# Patient Record
Sex: Female | Born: 1985 | Race: Asian | Hispanic: No | Marital: Single | Smoking: Never smoker
Health system: Southern US, Academic
[De-identification: ages and names within clinical notes are randomized; demographics above are authoritative.]

## PROBLEM LIST (undated history)

## (undated) DIAGNOSIS — Z789 Other specified health status: Secondary | ICD-10-CM

## (undated) HISTORY — PX: HX NO SURGICAL PROCEDURES: 2100001501

---

## 2009-08-03 HISTORY — PX: NM RENAL LASIX (ARMC HX): HXRAD1213

## 2013-11-20 ENCOUNTER — Other Ambulatory Visit (INDEPENDENT_AMBULATORY_CARE_PROVIDER_SITE_OTHER): Payer: PRIVATE HEALTH INSURANCE

## 2013-11-20 ENCOUNTER — Ambulatory Visit (INDEPENDENT_AMBULATORY_CARE_PROVIDER_SITE_OTHER): Payer: PRIVATE HEALTH INSURANCE

## 2013-11-20 ENCOUNTER — Encounter (INDEPENDENT_AMBULATORY_CARE_PROVIDER_SITE_OTHER): Payer: Self-pay

## 2013-11-20 VITALS — BP 123/83 | HR 89 | Temp 98.4°F | Resp 16 | Ht 63.0 in | Wt 148.0 lb

## 2013-11-20 DIAGNOSIS — R0789 Other chest pain: Secondary | ICD-10-CM

## 2013-11-20 MED ORDER — IBUPROFEN 200 MG TABLET
200.0000 mg | ORAL_TABLET | Freq: Once | ORAL | Status: AC
Start: 2013-11-20 — End: 2013-11-20

## 2013-11-20 MED ORDER — IBUPROFEN 400 MG TABLET
ORAL_TABLET | ORAL | Status: AC
Start: 2013-11-20 — End: ?

## 2013-11-20 NOTE — Progress Notes (Addendum)
History of Present Illness: Joann Garcia is a 28 y.o. female who presents to the Urgent Care/Student Health today with chief complaint of    Chief Complaint    Chest Pain  for past 24 hours. radiates to left side. pt states took combiflam on friday for cramping.       .     Location: left side of chest   Quality: pain   Onset: yesterday afternoon   Severity: mild to moderate   Timing: continuing   Context: Pt reports chest pain for the past 2 days with pain beginning while sitting on her bed working on her laptop. Pt reports pain radiating to her back and went away after a short time. Pt denies any trauma to the area. Pt denies any Hx of heart problems. Pt reports starting menstrual period in the past few days. Pt denies any other medical problems. Pt denies any known drug allergies. Pt denies any known sick contacts with similar sxs.   Modifying factors: Pt denies taking any medication for relief of sxs. Pt reports taking combiflam  Associated symptoms: left sided chest/intercostal muscle pain/tenderness, menstrual cramping   Denies: fever, N/V/D, abd pain, SOB, fatigue, myalgias, rashes, headache, palpitations, numbness, tingling, weakness, lower extremity swelling     I reviewed and confirmed the patient's past medical history taken by the nurse or medical assistant with the addition of the following:    Past Medical History:    No past medical history on file.  Past Surgical History:    Past Surgical History   Procedure Laterality Date    Hx no surgical procedures       Allergies:  No Known Allergies  Medications:    Current Outpatient Prescriptions   Medication Sig    Ibuprofen (MOTRIN IB) 200 mg Oral Tablet Take 1 Tab (200 mg total) by mouth One time for 1 dose    Ibuprofen (MOTRIN) 400 mg Oral Tablet Take one pill by mouth every 6 hours as needed with food.     Social History:    History   Substance Use Topics    Smoking status: Never Smoker     Smokeless tobacco: Never Used    Alcohol Use: No          Family History: No significant family history.  Family History   Problem Relation Age of Onset    Healthy Mother     Healthy Father      Review of Systems:    General: no fever and no fatigue  ENT:  no sore throat  Pulmonary:   no dry cough and no SOB  Cardiovascular:  no palpitations  Gastrointestinal:  no nausea, no vomiting, no diarrhea and no abdominal pain  Musculoskeletal:  no myalgias and left side chest/intercostal muscle tenderness/pain   Neurologic:  no paresthesias, no weakness and no headache  Skin:  no rash, no lower extremity swelling  All other review of systems were negative    Physical Exam:  Vital signs:   Filed Vitals:    11/20/13 1439   BP: 123/83   Pulse: 89   Temp: 36.9 C (98.4 F)   TempSrc: Thermal Scan   Resp: 16   Height: 1.6 m (5\' 3" )   Weight: 67.132 kg (148 lb)   SpO2: 98%     Patient's last menstrual period was 11/17/2013.    General:  Well appearing and No acute distress  Head:  Normocephalic and Atraumatic  Eyes:  Normal lids/lashes and normal conjunctiva  Neck:  supple  Pulmonary:  clear to auscultation bilaterally, no wheezes, no rales and no rhonchi  Cardiovascular:  regular rate/rhythm, normal S1/S2 and no murmur/rub/gallop  Gastrointestinal:  Non-distended, normal bowel sounds, soft, non-tender, no hepatosplenomegaly, no rebound and no guarding  Musculoskeletal:  intercostal tenderness to palpation below left breast   Skin:  warm/dry and no rash  Psychiatric:  Appropriate affect and behavior and Normal speech  Neurologic:   Alert and oriented x 3 and No meningeal signs noted  Hem/Lymph:  No lymphadenopathy    Data Reviewed:    Radiography:  CXR: XR-negative    Course: Condition at discharge: Good     Differential Diagnosis: musculoskeletal pain vs rib contusion vs pneumothorax vs PE vs herpes zoster     Assessment:   1. Chest pain, atypical        Plan:    Orders Placed This Encounter    CANCELED: Abdominal Flat and Upright with PA-Lateral Chest    CXR PA and Lat     Ibuprofen (MOTRIN IB) 200 mg Oral Tablet    Ibuprofen (MOTRIN) 400 mg Oral Tablet     Medications E-M: Ibuprofen  200mg  tablet  Route of Administration: PO  NDC #: [id number]  LOT #: [id number]  Expiration date: 09/22/14  Manufacturer: [employer]  Clinic Supplied: Yes    Pt was PERC negative.   Advised pt to rest and drink plenty of fluids.   Advised pt to take ibuprofen for relief of pain.     Go to Emergency Department immediately for further work up if any concerning symptoms.  Plan was discussed and patient verbalized understanding.  If symptoms are worsening or not improving the patient should return to the Urgent Care/Student Health for further evaluation.    I am scribing for, and in the presence of, Dr. Jesse SansAllison Tadros for services provided on 11/20/2013.  Joann Garcia, SCRIBE     Joann Garcia, [place]CRIB[place] 11/20/2013, 15:36  I personally performed the services described in this documentation, as scribed  in my presence, and it is both accurate  and complete.    Jesse SansAllison Tadros, MD

## 2013-11-20 NOTE — Patient Instructions (Signed)
Chest Wall Pain  Chest wall pain is pain in or around the bones and muscles of your chest. It may take up to 6 weeks to get better. It may take longer if you must stay physically active in your work and activities.   CAUSES   Chest wall pain may happen on its own. However, it may be caused by:   A viral illness like the flu.   Injury.   Coughing.   Exercise.   Arthritis.   Fibromyalgia.   Shingles.  HOME CARE INSTRUCTIONS    Avoid overtiring physical activity. Try not to strain or perform activities that cause pain. This includes any activities using your chest or your abdominal and side muscles, especially if heavy weights are used.   Put ice on the sore area.   Put ice in a plastic bag.   Place a towel between your skin and the bag.   Leave the ice on for 15-20 minutes per hour while awake for the first 2 days.   Only take over-the-counter or prescription medicines for pain, discomfort, or fever as directed by your caregiver.  SEEK IMMEDIATE MEDICAL CARE IF:    Your pain increases, or you are very uncomfortable.   You have a fever.   Your chest pain becomes worse.   You have new, unexplained symptoms.   You have nausea or vomiting.   You feel sweaty or lightheaded.   You have a cough with phlegm (sputum), or you cough up blood.  MAKE SURE YOU:    Understand these instructions.   Will watch your condition.   Will get help right away if you are not doing well or get worse.  Document Released: 07/20/2005 Document Revised: 10/12/2011 Document Reviewed: 03/16/2011  [facility] Patient Information 2015 [place]. This information is not intended to replace advice given to you by your health care provider. Make sure you discuss any questions you have with your health care provider.    HEALTH [facility]UC[facility]  [address]  [place] [id number]  Dept: [phone]  Dept Fax: [phone]  Loc: [phone]  Loc Fax: [phone]    Attending  Caregiver: Jesse SansAllison Levern Kalka, MD      Today's orders:   Orders Placed This Encounter    CANCELED: Abdominal Flat and Upright with PA-Lateral Chest    CXR PA and Lat    Ibuprofen (MOTRIN IB) 200 mg Oral Tablet           Prescription(s) E-Rx to:  [facility] - [place], [place] - [address] AT [facility] [address]      PLEASE ACTIVATE YOUR [employer] MYCHART. THIS IS ONE EASY WAY TO COMMUNICATE WITH [facility].  SEE THE CODE ON THIS FORM FOR ACCESS.    -----------------------------PRIVACY INFORMATION-----------------------------------------------  As a [place] student, regardless of your age, we cannot discuss your personal health information with a parent, spouse, family member or anyone else without your expressed consent.    This policy does not include:  Individuals who would have a legitimate reason to access your records and information to assist in your care under the provisions of HIPAA [facility](Health Insurance Portability and Accountability Act) law;   Individuals with whom you have previously given expressed written consent to do so, such a legal guardian or Power of [address].    No one can access your [facility], unless you give them your account sign on and password. You will receive emails  from [facility].  This means anyone who has access to the email account you provided can see this notification. There will be no private medical information included in these emails. This notification of  new medical information  available in your [facility], may be information that you do not want others to know.   _______________________________________________________________________    If your symptoms persist, worsen or you develop any new or concerning symptoms please call [facility] for at [phone] for a follow up appointment.   If your symptoms are severe, go immediately to the emergency department or call 911.  Please check with your health insurance coverage for these types  of visits.   Please keep all follow up visit recommended at today's visit.    If you received x-rays during your visit, be aware that the final and formal interpretation of those films by a radiologist will occur after your discharge.  If there is a significant discrepancy identified after your discharge, we will contact you at the telephone number provided during registration.  These results are available for your review on [facility].    Please refer to your [facility] for lab test results. Lab test results related to HIV, STI's and pregnancy are considered private and are therefore not immediately visible in your [facility]Chart, we may still be able to send a generic MyChart message for these results.  If you have cultures pending for sexually transmitted diseases, you will be contacted by phone if your cultures are positive.     Positive cultures are reported to the [facility] Department of Health, as required by state law. These results are considered private on [facility] and can only be released by the provider.We will not contact you if the results are negative.    It is very important that we have a phone number that is the single best way to contact you in the event that we become aware of important clinical information or concerns after your discharge.  If the phone number you provided at registration is NOT this number you should inform staff and registration prior to leaving.     Please call  Student Health at [phone] with any further questions.    Instructions discussed with patient upon discharge by clinical staff with all questions answered.    Jesse SansAllison Nathaniel Wakeley, MD 11/20/2013, 15:15

## 2015-01-04 ENCOUNTER — Ambulatory Visit (INDEPENDENT_AMBULATORY_CARE_PROVIDER_SITE_OTHER): Payer: PRIVATE HEALTH INSURANCE

## 2015-01-04 ENCOUNTER — Encounter (INDEPENDENT_AMBULATORY_CARE_PROVIDER_SITE_OTHER): Payer: Self-pay

## 2015-01-04 VITALS — BP 133/89 | HR 126 | Temp 98.4°F | Resp 18 | Ht 64.0 in | Wt 164.4 lb

## 2015-01-04 DIAGNOSIS — B349 Viral infection, unspecified: Secondary | ICD-10-CM

## 2015-01-04 DIAGNOSIS — J029 Acute pharyngitis, unspecified: Secondary | ICD-10-CM

## 2015-01-04 MED ORDER — FLUTICASONE PROPIONATE 50 MCG/ACTUATION NASAL SPRAY,SUSPENSION
1.0000 | Freq: Every day | NASAL | Status: AC
Start: 2015-01-04 — End: ?

## 2015-01-04 NOTE — Patient Instructions (Signed)
Salt Water Gargle  This solution will help make your mouth and throat feel better.  HOME CARE INSTRUCTIONS    Mix 1 teaspoon of salt in 8 ounces of warm water.   Gargle with this solution as much or often as you need or as directed. Swish and gargle gently if you have any sores or wounds in your mouth.   Do not swallow this mixture.     This information is not intended to replace advice given to you by your health care provider. Make sure you discuss any questions you have with your health care provider.     Document Released: 04/23/2004 Document Revised: 10/12/2011 Document Reviewed: 09/14/2008  [facility] Patient Information 2016 [place], [place].  Viral Infections  A virus is a type of germ. Viruses can cause:   Minor sore throats.   Aches and pains.   Headaches.   Runny nose.   Rashes.   Watery eyes.   Tiredness.   Coughs.   Loss of appetite.   Feeling sick to your stomach (nausea).   Throwing up (vomiting).   Watery poop (diarrhea).  HOME CARE    Only take medicines as told by your doctor.   Drink enough water and fluids to keep your pee (urine) clear or pale yellow. Sports drinks are a good choice.   Get plenty of rest and eat healthy. Soups and broths with crackers or rice are fine.  GET HELP RIGHT AWAY IF:    You have a very bad headache.   You have shortness of breath.   You have chest pain or neck pain.   You have an unusual rash.   You cannot stop throwing up.   You have watery poop that does not stop.   You cannot keep fluids down.   You or your child has a temperature by mouth above 102 F (38.9 C), not controlled by medicine.   Your baby is older than 3 months with a rectal temperature of 102 F (38.9 C) or higher.   Your baby is 36 months old or younger with a rectal temperature of 100.4 F (38 C) or higher.  MAKE SURE YOU:    Understand these instructions.   Will watch this condition.   Will get help right away if you are not doing well or get worse.     This information  is not intended to replace advice given to you by your health care provider. Make sure you discuss any questions you have with your health care provider.     Document Released: 07/02/2008 Document Revised: 10/12/2011 Document Reviewed: 11/25/2010  ExitCare Patient Information 2016 [place].     [facility]      Operated by [facility]  [address] [place], [place] [address]  Phone: [phone])  Fax: [phone]  [email or web address]  Open Daily 8:00a - 8:00p    Closed Thanksgiving and Christmas Day  [facility]     Operated by [facility]  [address]  [facility] and Education Building  [place], [place] [address]  Phone: [phone])  Fax: [phone]  [email or web address]  Open M-F  8:00a - 8:00p  Sat              10:00a - 4:00p  Sun             Closed  Closed all [place] Holidays        Attending Caregiver: Theresia Majors, FNP  Today's orders:   Orders Placed This Encounter    POCT RAPID STREP A    fluticasone (FLONASE) 50 mcg/actuation Nasal Spray, Suspension        Prescription(s) E-Rx to:  [facility] - [place], [place] - [address] AT [address]    ________________________________________________________________________  Short Term Disability and Family Medical Leave Act  [facility] does NOT provide assistance with any disability applications.  If you feel your medical condition requires you to be on disability, you will need to follow up with  your primary care physician or a specialist.  We apologize for any inconvenience.    For Medication Prescribed by [facility]:  As an Urgent Care facility, our clinic does NOT offer prescription refills over the telephone.    If you need more of the medication one of our medical providers prescribed, you will  either need to be re-evaluated by us or see your primary care physician.       ________________________________________________________________________      It is very important that we have a phone number.  This is the single best way to contact you in the event that we become aware of important clinical information or concerns after your discharge.  If the phone number you provided at registration is NOT this number you should inform staff and registration prior to leaving.      Your treatment and evaluation today was focused on identifying and treating potentially emergent conditions based on your presenting signs, symptoms, and history.  The resulting initial clinical impression and treatment plan is not intended to be definitive or a substitute for a full physical examination and evaluation by your primary care provider.  If your symptoms persist, worsen, or you develop any new or concerning symptoms, you need to be evaluated.      If you received x-rays during your visit, be aware that the final and formal interpretation of those films by a radiologist may occur after your discharge.  If there is a significant discrepancy identified after your discharge, we will contact you at the telephone number provided at registration.      If you received a pelvic exam, you may have cultures pending for sexually transmitted diseases.  Positive cultures are reported to the [facility] Department of Health as required by state law.  You may contact the Health Information Management Office of [facility] to get a copy of your results.     If you are over 27 year old, we cannot discuss your personal health information with a parent, spouse, family member, or anyone else without your consent.  This does not include those who have legitimate access to your records and information to assist in your care under the provisions of HIPAA [facility](Health Insurance Portability and Accountability Act) law, or those to whom you have previously given written consent to do so, such a legal guardian or Power of [place].       Instructions are discussed with patient upon discharge by clinical staff with all questions answered.  Please call [facility] [phone]) if any further questions develop.  Go immediately to the emergency department if any concerns or worsening symptoms.      Theresia MajorsBeth Braeleigh Pyper, FNP 01/04/2015, 13:12

## 2015-01-04 NOTE — Progress Notes (Addendum)
Attending:  Dr. Joline Maxcyodd Atarah Cadogan, MD  History of Present Illness: Joann Garcia is a 29 y.o. female who presents to the Student [facility] and Urgent Care today with chief complaint of    Chief Complaint     Sore Throat symptoms started yesterday morning    Fever     Generalized Body Aches           . Pt complains fever, chills, sore throat, HA, congestion, cough, and myalgias since yesterday.  She says that her symptoms started with the sore throat and chills.  Patient mentions that she took Vick's OTC medine.  Pt denies:  Drainage, N/v/d and all other s/s.    Location: throat, generalized  Quality: sore  Onset: yesterday  Severity: mild  Timing: constant  Context: symptoms started with sore throat and chills  Modifying factors: no improvement with Vick's OTC medicine.  Associated symptoms: fever, chills, HA, congestion, cough, and myalgias    I reviewed and confirmed the patient's past medical history taken by the nurse or medical assistant with the addition of the following:    Past Medical History:    History reviewed. No pertinent past medical history.    Past Surgical History:    Past Surgical History   Procedure Laterality Date    Hx no surgical procedures       Allergies:  No Known Allergies     Medications:    Current Outpatient Prescriptions   Medication Sig    fluticasone (FLONASE) 50 mcg/actuation Nasal Spray, Suspension 1 Spray by Each Nostril route Once a day    Ibuprofen (MOTRIN) 400 mg Oral Tablet Take one pill by mouth every 6 hours as needed with food. (Patient not taking: Reported on 01/04/2015)     Social History:    History   Substance Use Topics    Smoking status: Never Smoker     Smokeless tobacco: Never Used    Alcohol Use: No     Family History: No significant family history.  Family History   Problem Relation Age of Onset    Healthy Mother     Healthy Father      Review of Systems:    General: fever, chills, myalgias  ENT:  sore throat, congestion, no drainage  Gastrointestinal: no nausea,  no vomiting, no diarrhea  Pulmonary: cough  Neurologic:  headache  All other review of systems were negative    Physical Exam:  Vital signs:   Filed Vitals:    01/04/15 1300   BP: 133/89   Pulse: 126   Temp: 36.9 C (98.4 F)   TempSrc: Thermal Scan   Resp: 18   Height: 1.626 m (5\' 4" )   Weight: 74.571 kg (164 lb 6.4 oz)   SpO2: 97%     Body mass index is 28.21 kg/(m^2). Normalized weight-for-age data available only for age 82 to 20 years.  Patient's last menstrual period was 12/04/2014 (approximate).    General:  Well appearing and No acute distress  Head:  Normocephalic and Atraumatic  Eyes:  Normal lids/lashes and normal conjunctiva  Neck:  supple  Pulmonary:  clear to auscultation bilaterally, no wheezes, no rales and no rhonchi  Cardiovascular:  regular rate/rhythm  Skin:  warm/dry and no rash  Psychiatric:  Appropriate affect and behavior  Neurologic:   Alert and oriented x 3  ENT:  normal EAC's, normal TM's and pharynx/tonsils: mild erythema and post nasal drip, injected turbinates  Hem/Lymph:  No lymphadenopathy    Data Reviewed:  Point-of-care testing:     Rapid Strep: Negative                      Course: Condition at discharge: Good     Differential Diagnosis: URI vs. Sinusitis vs. Bacterial vs. Viral Pharyngitis.    Assessment:   1. Sore throat    2. Viral illness      Plan:    Orders Placed This Encounter    POCT RAPID STREP A    fluticasone (FLONASE) 50 mcg/actuation Nasal Spray, Suspension         Patient refused note for work.  Educated Pt on proper Rx use.  Advised Ibuprofen for relief: up to 600 mg every six hours.  Advised patient to drink lots of fluids.  Advised patient to use warm salt water gargles for relief.  Advised patient to avoid dairy.  F/U with STH/UC PRN.    Go to Emergency Department immediately for further work up if any concerning symptoms.  Plan was discussed and patient verbalized understanding.  If symptoms are worsening or not improving the patient should return to the  Student Health and Urgent Care for further evaluation.    I am scribing for, and in the presence of, Theresia Majors, APRN, for services provided on 01/04/2015.  Michaela Corner, SCRIBE     [place], SCRIBE 01/04/2015, 13:11    I personally performed the services described in this documentation, as scribed  in my presence, and it is both accurate  and complete.    Theresia Majors, FNP  The supervising physician was physically present in Urgent Care/student health and available for consultation, and did not participate in the care of this patient.

## 2015-01-04 NOTE — Progress Notes (Signed)
Patient has given verbal permission for the student scribe to assist the healthcare provider during the patients visit.Joann HardingBrent Amaani Guilbault, LPN 1/6/10966/10/2014, 04:5413:00

## 2020-09-09 ENCOUNTER — Encounter (HOSPITAL_COMMUNITY): Payer: Self-pay | Admitting: *Deleted

## 2020-09-09 ENCOUNTER — Inpatient Hospital Stay (HOSPITAL_COMMUNITY)
Admission: AD | Admit: 2020-09-09 | Discharge: 2020-09-09 | Disposition: A | Discharge status: Home / Self Care | Payer: Managed Care, Other (non HMO) | Source: Ambulatory Visit | Attending: Obstetrics and Gynecology | Admitting: Obstetrics and Gynecology

## 2020-09-09 ENCOUNTER — Inpatient Hospital Stay (HOSPITAL_COMMUNITY): Payer: Managed Care, Other (non HMO)

## 2020-09-09 DIAGNOSIS — O3412 Maternal care for benign tumor of corpus uteri, second trimester: Secondary | ICD-10-CM | POA: Insufficient documentation

## 2020-09-09 DIAGNOSIS — Z3686 Encounter for antenatal screening for cervical length: Secondary | ICD-10-CM

## 2020-09-09 DIAGNOSIS — Z3A19 19 weeks gestation of pregnancy: Secondary | ICD-10-CM | POA: Insufficient documentation

## 2020-09-09 DIAGNOSIS — R102 Pelvic and perineal pain: Secondary | ICD-10-CM | POA: Diagnosis not present

## 2020-09-09 DIAGNOSIS — O26892 Other specified pregnancy related conditions, second trimester: Secondary | ICD-10-CM

## 2020-09-09 DIAGNOSIS — D259 Leiomyoma of uterus, unspecified: Secondary | ICD-10-CM | POA: Diagnosis not present

## 2020-09-09 HISTORY — DX: Other specified health status: Z78.9

## 2020-09-09 LAB — URINALYSIS, ROUTINE W REFLEX MICROSCOPIC
Bilirubin Urine: NEGATIVE
Glucose, UA: NEGATIVE mg/dL
Hgb urine dipstick: NEGATIVE
Ketones, ur: NEGATIVE mg/dL
Nitrite: NEGATIVE
Protein, ur: NEGATIVE mg/dL
Specific Gravity, Urine: 1.005 (ref 1.005–1.030)
pH: 8 (ref 5.0–8.0)

## 2020-09-09 LAB — COMPREHENSIVE METABOLIC PANEL
ALT: 28 U/L (ref 0–44)
AST: 22 U/L (ref 15–41)
Albumin: 3 g/dL — ABNORMAL LOW (ref 3.5–5.0)
Alkaline Phosphatase: 54 U/L (ref 38–126)
Anion gap: 10 (ref 5–15)
BUN: 5 mg/dL — ABNORMAL LOW (ref 6–20)
CO2: 20 mmol/L — ABNORMAL LOW (ref 22–32)
Calcium: 9.8 mg/dL (ref 8.9–10.3)
Chloride: 104 mmol/L (ref 98–111)
Creatinine, Ser: 0.54 mg/dL (ref 0.44–1.00)
GFR, Estimated: >60 mL/min (ref >60)
Glucose, Bld: 119 mg/dL — ABNORMAL HIGH (ref 70–99)
Potassium: 4.1 mmol/L (ref 3.5–5.1)
Sodium: 134 mmol/L — ABNORMAL LOW (ref 135–145)
Total Bilirubin: 0.4 mg/dL (ref 0.3–1.2)
Total Protein: 6.7 g/dL (ref 6.5–8.1)

## 2020-09-09 LAB — CBC WITH DIFFERENTIAL/PLATELET
Abs Immature Granulocytes: 0.15 10*3/uL — ABNORMAL HIGH (ref 0.00–0.07)
Basophils Absolute: 0.1 10*3/uL (ref 0.0–0.1)
Basophils Relative: 0 %
Eosinophils Absolute: 0.1 10*3/uL (ref 0.0–0.5)
Eosinophils Relative: 1 %
HCT: 41.3 % (ref 36.0–46.0)
Hemoglobin: 12.8 g/dL (ref 12.0–15.0)
Immature Granulocytes: 1 %
Lymphocytes Relative: 12 %
Lymphs Abs: 1.8 10*3/uL (ref 0.7–4.0)
MCH: 25.4 pg — ABNORMAL LOW (ref 26.0–34.0)
MCHC: 31 g/dL (ref 30.0–36.0)
MCV: 82.1 fL (ref 80.0–100.0)
Monocytes Absolute: 1 10*3/uL (ref 0.1–1.0)
Monocytes Relative: 7 %
Neutro Abs: 12.4 10*3/uL — ABNORMAL HIGH (ref 1.7–7.7)
Neutrophils Relative %: 79 %
Platelets: 250 10*3/uL (ref 150–400)
RBC: 5.03 MIL/uL (ref 3.87–5.11)
RDW: 14.9 % (ref 11.5–15.5)
WBC: 15.5 10*3/uL — ABNORMAL HIGH (ref 4.0–10.5)
nRBC: 0 % (ref 0.0–0.2)

## 2020-09-09 MED ORDER — OXYCODONE-ACETAMINOPHEN 5-325 MG PO TABS
1.0000 | ORAL_TABLET | Freq: Once | ORAL | Status: AC
  Administered 2020-09-09: 1 via ORAL
  Filled 2020-09-09: qty 1

## 2020-09-09 MED ORDER — OXYCODONE-ACETAMINOPHEN 5-325 MG PO TABS: 1.0000 | ORAL_TABLET | ORAL | 0 refills | Status: DC | PRN

## 2020-09-09 NOTE — MAU Note (Addendum)
Right pelvic pain since 4pm, Called Dr. Benjie Karvonen and took tylenol around 11pm. Still no relief,  denies bleeding or leaking of fluid. No complications with pregnancy

## 2020-09-09 NOTE — Discharge Instructions (Signed)
Safe Medications in Pregnancy   Acne: Benzoyl Peroxide Salicylic Acid  Backache/Headache: Tylenol: 2 regular strength every 4 hours OR              2 Extra strength every 6 hours  Colds/Coughs/Allergies: Benadryl (alcohol free) 25 mg every 6 hours as needed Breath right strips Claritin Cepacol throat lozenges Chloraseptic throat spray Cold-Eeze- up to three times per day Cough drops, alcohol free Flonase (by prescription only) Guaifenesin Mucinex Robitussin DM (plain only, alcohol free) Saline nasal spray/drops Sudafed (pseudoephedrine) & Actifed ** use only after [redacted] weeks gestation and if you do not have high blood pressure Tylenol Vicks Vaporub Zinc lozenges Zyrtec   Constipation: Colace Ducolax suppositories Fleet enema Glycerin suppositories Metamucil Milk of magnesia Miralax Senokot Smooth move tea  Diarrhea: Kaopectate Imodium A-D  *NO pepto Bismol  Hemorrhoids: Anusol Anusol HC Preparation H Tucks  Indigestion: Tums Maalox Mylanta Zantac  Pepcid  Insomnia: Benadryl (alcohol free) 25mg  every 6 hours as needed Tylenol PM Unisom, no Gelcaps  Leg Cramps: Tums MagGel  Nausea/Vomiting:  Bonine Dramamine Emetrol Ginger extract Sea bands Meclizine  Nausea medication to take during pregnancy:  Unisom (doxylamine succinate 25 mg tablets) Take one tablet daily at bedtime. If symptoms are not adequately controlled, the dose can be increased to a maximum recommended dose of two tablets daily (1/2 tablet in the morning, 1/2 tablet mid-afternoon and one at bedtime). Vitamin B6 100mg  tablets. Take one tablet twice a day (up to 200 mg per day).  Skin Rashes: Aveeno products Benadryl cream or 25mg  every 6 hours as needed Calamine Lotion 1% cortisone cream  Yeast infection: Gyne-lotrimin 7 Monistat 7   **If taking multiple medications, please check labels to avoid duplicating the same active ingredients **take medication as directed on  the label ** Do not exceed 4000 mg of tylenol in 24 hours **Do not take medications that contain aspirin or ibuprofen     Uterine Fibroids  Uterine fibroids, also called leiomyomas, are noncancerous (benign) tumors that can grow in the uterus. They can cause heavy menstrual bleeding and pain. Fibroids may also grow in the fallopian tubes, cervix, or tissues (ligaments) near the uterus. You may have one or many fibroids. Fibroids vary in size, weight, and where they grow in the uterus. Some can become quite large. Most fibroids do not require medical treatment. What are the causes? The cause of this condition is not known. What increases the risk? You are more likely to develop this condition if you:  Are in your 30s or 40s and have not gone through menopause.  Have a family history of this condition.  Are of African American descent.  Started your menstrual period at age 43 or younger.  Have never given birth.  Are overweight or obese. What are the signs or symptoms? Many women do not have any symptoms. Symptoms of this condition may include:  Heavy menstrual bleeding.  Bleeding between menstrual periods.  Pain and pressure in the pelvic area, between your hip bones.  Pain during sex.  Bladder problems, such as needing to urinate right away or more often than usual.  Inability to have children (infertility).  Failure to carry pregnancy to term (miscarriage). How is this diagnosed? This condition may be diagnosed based on:  Your symptoms and medical history.  A physical exam.  A pelvic exam that includes feeling for any tumors.  Imaging tests, such as ultrasound or MRI. How is this treated? Treatment for this condition may include follow-up  visits with your health care provider to monitor your fibroids for any changes. Other treatment may include:  Medicines, such as: ? Medicines to relieve pain, including aspirin and NSAIDs, such as ibuprofen or  naproxen. ? Hormone therapy. Treatment may be given as a pill or an injection, or it may be inserted into the uterus using an intrauterine device (IUD).  Surgery that would do one of the following: ? Remove the fibroids (myomectomy). This may be recommended if fibroids affect your fertility and you want to become pregnant. ? Remove the uterus (hysterectomy). ? Block the blood supply to the fibroids (uterine artery embolization). This can cause them to shrink and die. Follow these instructions at home: Medicines  Take over-the-counter and prescription medicines only as told by your health care provider.  Ask your health care provider if you should take iron pills or eat more iron-rich foods, such as dark green, leafy vegetables. Heavy menstrual bleeding can cause low iron levels. Managing pain If directed, apply heat to your back or abdomen to reduce pain. Use the heat source that your health care provider recommends, such as a moist heat pack or a heating pad. To apply heat:  Place a towel between your skin and the heat source.  Leave the heat on for 20-30 minutes.  Remove the heat if your skin turns bright red. This is especially important if you are unable to feel pain, heat, or cold. You may have a greater risk of getting burned.   General instructions  Pay close attention to your menstrual cycle. Tell your health care provider about any changes, such as: ? Heavier bleeding that requires you to change your pads or tampons more than usual. ? A change in the number of days that your menstrual period lasts. ? A change in symptoms that come with your menstrual period, such as back pain or cramps in your abdomen.  Keep all follow-up visits. This is important, especially if your fibroids need to be monitored for any changes. Contact a health care provider if you:  Have pelvic pain, back pain, or cramps in your abdomen that do not get better with medicine or heat.  Develop new bleeding  between menstrual periods.  Have increased bleeding during or between menstrual periods.  Feel more tired or weak than usual.  Feel light-headed. Get help right away if you:  Faint.  Have pelvic pain that suddenly gets worse.  Have severe vaginal bleeding that soaks a tampon or pad in 30 minutes or less. Summary  Uterine fibroids are noncancerous (benign) tumors that can develop in the uterus.  The exact cause of this condition is not known.  Most fibroids do not require medical treatment unless they affect your ability to have children (fertility).  Contact a health care provider if you have pelvic pain, back pain, or cramps in your abdomen that do not get better with medicines.  Get help right away if you faint, have pelvic pain that suddenly gets worse, or have severe vaginal bleeding. This information is not intended to replace advice given to you by your health care provider. Make sure you discuss any questions you have with your health care provider. Document Revised: 02/20/2020 Document Reviewed: 02/20/2020 Elsevier Patient Education  [place].

## 2020-09-09 NOTE — MAU Provider Note (Signed)
History     CSN: [id number]  Arrival date and time: 09/09/20 0005   Event Date/Time   First Provider Initiated Contact with Patient 09/09/20 0023      Chief Complaint  Patient presents with  . Pelvic Pain   HPI Megan Mclaughlin is a 35 y.o. G1P0 at [redacted]w[redacted]d who presents with right lower abdominal pain. She states the pain started at 1600 and has continued throughout the evening. She reports taking one tylenol at 2300 with no relief. She denies any leaking, bleeding or discharge. She reports the pain is only on the right side of her abdomen. She denies any nausea, vomiting or fever at home. She denies any pain or frequency of urination. She reports a hx of chronic constipation but reports having a normal bowel movement this am. Patient reports she had her anatomy ultrasound this week and "everything was normal."  OB History    Gravida  1   Para      Term      Preterm      AB      Living        SAB      IAB      Ectopic      Multiple      Live Births              Past Medical History:  Diagnosis Date  . Medical history non-contributory     Past Surgical History:  Procedure Laterality Date  . NM RENAL LASIX ([place] HX)  2011    Family History  Problem Relation Age of Onset  . Asthma Mother   . Diabetes Father   . Hypertension Father   . Cancer Father     Social History   Tobacco Use  . Smoking status: Never Smoker  . Smokeless tobacco: Never Used  Substance Use Topics  . Alcohol use: Never  . Drug use: Never    Allergies: Not on File  Medications Prior to Admission  Medication Sig Dispense Refill Last Dose  . cholecalciferol (VITAMIN D3) 25 MCG (1000 UNIT) tablet Take 1,000 Units by mouth daily.   09/08/2020 at Unknown time  . Prenatal Vit-Fe Fumarate-FA (MULTIVITAMIN-PRENATAL) 27-0.8 MG TABS tablet Take by mouth daily at 12 noon.   09/08/2020 at Unknown time    Review of Systems  Constitutional: Negative.  Negative for fatigue and fever.  HENT:  Negative.   Respiratory: Negative.  Negative for shortness of breath.   Cardiovascular: Negative.  Negative for chest pain.  Gastrointestinal: Positive for abdominal pain. Negative for constipation, diarrhea, nausea and vomiting.  Genitourinary: Negative.  Negative for dysuria, vaginal bleeding and vaginal discharge.  Neurological: Negative.  Negative for dizziness and headaches.   Physical Exam   Blood pressure 129/80, pulse 99, temperature 99 F (37.2 C), temperature source Oral, resp. rate 18, height 5' 3.5" (1.613 m), weight 81.6 kg, SpO2 99 %.  Physical Exam Vitals and nursing note reviewed.  Constitutional:      General: She is not in acute distress.    Appearance: She is well-developed and well-nourished. She is diaphoretic.  HENT:     Head: Normocephalic.  Eyes:     Pupils: Pupils are equal, round, and reactive to light.  Cardiovascular:     Rate and Rhythm: Normal rate and regular rhythm.     Heart sounds: Normal heart sounds.  Pulmonary:     Effort: Pulmonary effort is normal. No respiratory distress.     Breath sounds: Normal  breath sounds.  Abdominal:     General: Bowel sounds are normal. There is no distension.     Palpations: Abdomen is soft. There is mass (palpable mass on right side just below fundus).     Tenderness: There is abdominal tenderness in the right lower quadrant and suprapubic area. There is guarding. There is no rebound. Negative signs include Murphy's sign and McBurney's sign.  Genitourinary:    Comments: Pelvic exam: Cervix pink, visually closed, without lesion, scant white creamy discharge, vaginal walls and external genitalia normal Bimanual exam: Cervix 0/long/high, firm, anterior, neg CMT, uterus nontender, adnexa without tenderness, enlargement, or mass  Skin:    General: Skin is warm.  Neurological:     Mental Status: She is alert and oriented to person, place, and time.  Psychiatric:        Mood and Affect: Mood and affect normal.         Behavior: Behavior normal.        Thought Content: Thought content normal.        Judgment: Judgment normal.     FHT: 153 bpm  MAU Course  Procedures Results for orders placed or performed during the hospital encounter of 09/09/20 (from the past 24 hour(s))  Urinalysis, Routine w reflex microscopic     Status: Abnormal   Collection Time: 09/09/20 12:48 AM  Result Value Ref Range   Color, Urine STRAW (A) YELLOW   APPearance CLEAR CLEAR   Specific Gravity, Urine 1.005 1.005 - 1.030   pH 8.0 5.0 - 8.0   Glucose, UA NEGATIVE NEGATIVE mg/dL   Hgb urine dipstick NEGATIVE NEGATIVE   Bilirubin Urine NEGATIVE NEGATIVE   Ketones, ur NEGATIVE NEGATIVE mg/dL   Protein, ur NEGATIVE NEGATIVE mg/dL   Nitrite NEGATIVE NEGATIVE   Leukocytes,Ua TRACE (A) NEGATIVE   RBC / HPF 0-5 0 - 5 RBC/hpf   WBC, UA 0-5 0 - 5 WBC/hpf   Bacteria, UA RARE (A) NONE SEEN   Squamous Epithelial / LPF 0-5 0 - 5  CBC with Differential/Platelet     Status: Abnormal   Collection Time: 09/09/20 12:50 AM  Result Value Ref Range   WBC 15.5 (H) 4.0 - 10.5 K/uL   RBC 5.03 3.87 - 5.11 MIL/uL   Hemoglobin 12.8 12.0 - 15.0 g/dL   HCT 41.3 36.0 - 46.0 %   MCV 82.1 80.0 - 100.0 fL   MCH 25.4 (L) 26.0 - 34.0 pg   MCHC 31.0 30.0 - 36.0 g/dL   RDW 14.9 11.5 - 15.5 %   Platelets 250 150 - 400 K/uL   nRBC 0.0 0.0 - 0.2 %   Neutrophils Relative % 79 %   Neutro Abs 12.4 (H) 1.7 - 7.7 K/uL   Lymphocytes Relative 12 %   Lymphs Abs 1.8 0.7 - 4.0 K/uL   Monocytes Relative 7 %   Monocytes Absolute 1.0 0.1 - 1.0 K/uL   Eosinophils Relative 1 %   Eosinophils Absolute 0.1 0.0 - 0.5 K/uL   Basophils Relative 0 %   Basophils Absolute 0.1 0.0 - 0.1 K/uL   Immature Granulocytes 1 %   Abs Immature Granulocytes 0.15 (H) 0.00 - 0.07 K/uL  Comprehensive metabolic panel     Status: Abnormal   Collection Time: 09/09/20 12:50 AM  Result Value Ref Range   Sodium 134 (L) 135 - 145 mmol/L   Potassium 4.1 3.5 - 5.1 mmol/L   Chloride 104 98  - 111 mmol/L   CO2 20 (L) 22 -  32 mmol/L   Glucose, Bld 119 (H) 70 - 99 mg/dL   BUN 5 (L) 6 - 20 mg/dL   Creatinine, Ser 0.54 0.44 - 1.00 mg/dL   Calcium 9.8 8.9 - 10.3 mg/dL   Total Protein 6.7 6.5 - 8.1 g/dL   Albumin 3.0 (L) 3.5 - 5.0 g/dL   AST 22 15 - 41 U/L   ALT 28 0 - 44 U/L   Alkaline Phosphatase 54 38 - 126 U/L   Total Bilirubin 0.4 0.3 - 1.2 mg/dL   GFR, Estimated >60 >60 mL/min   Anion gap 10 5 - 15   MDM Prenatal records from private office reviewed. Pregnancy complicated by chronic constipation. Labs ordered and reviewed.  UA, UC CBC with Diff, CMP Percocet PO- patient reports some improvement US OB Transvaginal- cervical length 3.1cm, posterior placenta, normal AFI  Right myoma measuring 6.4x5.2x7cm  Reviewed results with patient and partner. Discussed pain could be related to fibroid growth in pregnancy vs fibroid degeneration. Discussed coping mechanisms at length including natural and pharmacologic methods.   Discussed with patient and support person that pain is likely related to fibroid but cannot rule out appendicitis without MRI. Patient declines and verbalized understanding that right sided pain with elevated WBC could be related to appendix but feels that pain is fibroid in nature. Reviewed warning signs at length for appendicitis and encouraged patient to come back if these develop.   Assessment and Plan   1. Pelvic pain affecting pregnancy in second trimester, antepartum   2. Encounter for antenatal screening for cervical length   3. [redacted] weeks gestation of pregnancy   4. Uterine leiomyoma, unspecified location    -Discharge home in stable condition -Rx for percocet sent to patient's pharmacy -Appendicits precautions discussed -Patient advised to follow-up with OB this week to discuss further management of fibroid pain in pregnancy. -Patient may return to MAU as needed or if her condition were to change or worsen   [place] 09/09/2020,  1:35 AM

## 2020-09-10 LAB — CULTURE, OB URINE

## 2020-09-23 ENCOUNTER — Ambulatory Visit
Admission: RE | Admit: 2020-09-23 | Discharge: 2020-09-23 | Disposition: A | Discharge status: Home / Self Care | Payer: Managed Care, Other (non HMO) | Source: Ambulatory Visit | Attending: Family Medicine | Admitting: Family Medicine

## 2020-09-23 ENCOUNTER — Other Ambulatory Visit: Payer: Self-pay | Admitting: Family Medicine

## 2020-09-23 DIAGNOSIS — R7612 Nonspecific reaction to cell mediated immunity measurement of gamma interferon antigen response without active tuberculosis: Secondary | ICD-10-CM

## 2020-12-24 ENCOUNTER — Inpatient Hospital Stay (HOSPITAL_COMMUNITY)
Admission: AD | Admit: 2020-12-24 | Discharge: 2020-12-27 | DRG: 788 | Disposition: A | Discharge status: Home / Self Care | Payer: Managed Care, Other (non HMO) | Attending: Obstetrics and Gynecology | Admitting: Obstetrics and Gynecology

## 2020-12-24 ENCOUNTER — Other Ambulatory Visit: Payer: Self-pay

## 2020-12-24 ENCOUNTER — Inpatient Hospital Stay (HOSPITAL_BASED_OUTPATIENT_CLINIC_OR_DEPARTMENT_OTHER): Payer: Managed Care, Other (non HMO)

## 2020-12-24 ENCOUNTER — Encounter (HOSPITAL_COMMUNITY)
Admission: AD | Disposition: A | Discharge status: Home / Self Care | Payer: Self-pay | Source: Home / Self Care | Attending: Obstetrics and Gynecology

## 2020-12-24 ENCOUNTER — Inpatient Hospital Stay (HOSPITAL_COMMUNITY): Payer: Managed Care, Other (non HMO) | Admitting: Anesthesiology

## 2020-12-24 ENCOUNTER — Encounter (HOSPITAL_COMMUNITY): Payer: Self-pay | Admitting: Obstetrics and Gynecology

## 2020-12-24 DIAGNOSIS — O99214 Obesity complicating childbirth: Secondary | ICD-10-CM | POA: Diagnosis present

## 2020-12-24 DIAGNOSIS — D259 Leiomyoma of uterus, unspecified: Secondary | ICD-10-CM | POA: Diagnosis present

## 2020-12-24 DIAGNOSIS — Z3689 Encounter for other specified antenatal screening: Secondary | ICD-10-CM

## 2020-12-24 DIAGNOSIS — Z369 Encounter for antenatal screening, unspecified: Secondary | ICD-10-CM | POA: Diagnosis not present

## 2020-12-24 DIAGNOSIS — O3413 Maternal care for benign tumor of corpus uteri, third trimester: Secondary | ICD-10-CM | POA: Diagnosis present

## 2020-12-24 DIAGNOSIS — Z8759 Personal history of other complications of pregnancy, childbirth and the puerperium: Secondary | ICD-10-CM

## 2020-12-24 DIAGNOSIS — Z3A34 34 weeks gestation of pregnancy: Secondary | ICD-10-CM

## 2020-12-24 DIAGNOSIS — O42913 Preterm premature rupture of membranes, unspecified as to length of time between rupture and onset of labor, third trimester: Secondary | ICD-10-CM | POA: Diagnosis present

## 2020-12-24 DIAGNOSIS — Z20822 Contact with and (suspected) exposure to covid-19: Secondary | ICD-10-CM | POA: Diagnosis present

## 2020-12-24 DIAGNOSIS — O42919 Preterm premature rupture of membranes, unspecified as to length of time between rupture and onset of labor, unspecified trimester: Secondary | ICD-10-CM | POA: Diagnosis present

## 2020-12-24 DIAGNOSIS — Z98891 History of uterine scar from previous surgery: Secondary | ICD-10-CM

## 2020-12-24 LAB — CBC WITH DIFFERENTIAL/PLATELET
Abs Immature Granulocytes: 0.23 10*3/uL — ABNORMAL HIGH (ref 0.00–0.07)
Basophils Absolute: 0 10*3/uL (ref 0.0–0.1)
Basophils Relative: 0 %
Eosinophils Absolute: 0.1 10*3/uL (ref 0.0–0.5)
Eosinophils Relative: 1 %
HCT: 39 % (ref 36.0–46.0)
Hemoglobin: 12.5 g/dL (ref 12.0–15.0)
Immature Granulocytes: 2 %
Lymphocytes Relative: 18 %
Lymphs Abs: 2.1 10*3/uL (ref 0.7–4.0)
MCH: 26.5 pg (ref 26.0–34.0)
MCHC: 32.1 g/dL (ref 30.0–36.0)
MCV: 82.8 fL (ref 80.0–100.0)
Monocytes Absolute: 0.9 10*3/uL (ref 0.1–1.0)
Monocytes Relative: 8 %
Neutro Abs: 8.3 10*3/uL — ABNORMAL HIGH (ref 1.7–7.7)
Neutrophils Relative %: 71 %
Platelets: 254 10*3/uL (ref 150–400)
RBC: 4.71 MIL/uL (ref 3.87–5.11)
RDW: 15.2 % (ref 11.5–15.5)
WBC: 11.7 10*3/uL — ABNORMAL HIGH (ref 4.0–10.5)
nRBC: 0 % (ref 0.0–0.2)

## 2020-12-24 LAB — RESP PANEL BY RT-PCR (FLU A&B, COVID) ARPGX2
Influenza A by PCR: NEGATIVE
Influenza B by PCR: NEGATIVE
SARS Coronavirus 2 by RT PCR: NEGATIVE

## 2020-12-24 LAB — CBC
HCT: 38.6 % (ref 36.0–46.0)
Hemoglobin: 12.3 g/dL (ref 12.0–15.0)
MCH: 26.5 pg (ref 26.0–34.0)
MCHC: 31.9 g/dL (ref 30.0–36.0)
MCV: 83 fL (ref 80.0–100.0)
Platelets: 247 10*3/uL (ref 150–400)
RBC: 4.65 MIL/uL (ref 3.87–5.11)
RDW: 15.3 % (ref 11.5–15.5)
WBC: 12 10*3/uL — ABNORMAL HIGH (ref 4.0–10.5)
nRBC: 0 % (ref 0.0–0.2)

## 2020-12-24 LAB — TYPE AND SCREEN
ABO/RH(D): O POS
Antibody Screen: NEGATIVE

## 2020-12-24 LAB — HEPATITIS B SURFACE ANTIGEN: Hepatitis B Surface Ag: NONREACTIVE

## 2020-12-24 SURGERY — Surgical Case
Anesthesia: Spinal

## 2020-12-24 MED ORDER — NALOXONE HCL 4 MG/10ML IJ SOLN
1.0000 ug/kg/h | INTRAVENOUS | Status: DC | PRN
  Filled 2020-12-24: qty 5

## 2020-12-24 MED ORDER — DIPHENHYDRAMINE HCL 25 MG PO CAPS: 25.0000 mg | ORAL_CAPSULE | ORAL | Status: DC | PRN

## 2020-12-24 MED ORDER — MORPHINE SULFATE (PF) 0.5 MG/ML IJ SOLN
INTRAMUSCULAR | Status: DC | PRN
  Administered 2020-12-24: .15 mg via INTRATHECAL

## 2020-12-24 MED ORDER — OXYCODONE HCL 5 MG PO TABS
5.0000 mg | ORAL_TABLET | Freq: Once | ORAL | Status: DC | PRN
Start: 2020-12-24 — End: 2020-12-24

## 2020-12-24 MED ORDER — SODIUM CHLORIDE 0.9% FLUSH: 3.0000 mL | INTRAVENOUS | Status: DC | PRN

## 2020-12-24 MED ORDER — KETOROLAC TROMETHAMINE 30 MG/ML IJ SOLN: 30.0000 mg | Freq: Four times a day (QID) | INTRAMUSCULAR | Status: AC | PRN

## 2020-12-24 MED ORDER — DOCUSATE SODIUM 100 MG PO CAPS: 100.0000 mg | ORAL_CAPSULE | Freq: Every day | ORAL | Status: DC

## 2020-12-24 MED ORDER — BETAMETHASONE SOD PHOS & ACET 6 (3-3) MG/ML IJ SUSP
12.0000 mg | INTRAMUSCULAR | Status: DC
  Filled 2020-12-24: qty 5

## 2020-12-24 MED ORDER — TETANUS-DIPHTH-ACELL PERTUSSIS 5-2.5-18.5 LF-MCG/0.5 IM SUSY: 0.5000 mL | PREFILLED_SYRINGE | Freq: Once | INTRAMUSCULAR | Status: DC

## 2020-12-24 MED ORDER — SODIUM CHLORIDE 0.9% FLUSH: 3.0000 mL | Freq: Two times a day (BID) | INTRAVENOUS | Status: DC

## 2020-12-24 MED ORDER — PRENATAL MULTIVITAMIN CH: 1.0000 | ORAL_TABLET | Freq: Every day | ORAL | Status: DC

## 2020-12-24 MED ORDER — ACETAMINOPHEN 10 MG/ML IV SOLN
INTRAVENOUS | Status: AC
  Filled 2020-12-24: qty 100

## 2020-12-24 MED ORDER — ONDANSETRON HCL 4 MG/2ML IJ SOLN
INTRAMUSCULAR | Status: DC | PRN
  Administered 2020-12-24: 4 mg via INTRAVENOUS

## 2020-12-24 MED ORDER — PROMETHAZINE HCL 25 MG/ML IJ SOLN: 6.2500 mg | INTRAMUSCULAR | Status: DC | PRN

## 2020-12-24 MED ORDER — PRENATAL MULTIVITAMIN CH
1.0000 | ORAL_TABLET | Freq: Every day | ORAL | Status: DC
  Administered 2020-12-25 – 2020-12-27 (×3): 1 via ORAL
  Filled 2020-12-24 (×3): qty 1

## 2020-12-24 MED ORDER — KETOROLAC TROMETHAMINE 30 MG/ML IJ SOLN
INTRAMUSCULAR | Status: AC
  Filled 2020-12-24: qty 1

## 2020-12-24 MED ORDER — LACTATED RINGERS IV SOLN: INTRAVENOUS | Status: DC

## 2020-12-24 MED ORDER — MENTHOL 3 MG MT LOZG: 1.0000 | LOZENGE | OROMUCOSAL | Status: DC | PRN

## 2020-12-24 MED ORDER — OXYCODONE HCL 5 MG/5ML PO SOLN: 5.0000 mg | Freq: Once | ORAL | Status: DC | PRN

## 2020-12-24 MED ORDER — PHENYLEPHRINE HCL-NACL 20-0.9 MG/250ML-% IV SOLN
INTRAVENOUS | Status: DC | PRN
  Administered 2020-12-24: 60 ug/min via INTRAVENOUS

## 2020-12-24 MED ORDER — OXYTOCIN-SODIUM CHLORIDE 30-0.9 UT/500ML-% IV SOLN
INTRAVENOUS | Status: AC
  Filled 2020-12-24: qty 500

## 2020-12-24 MED ORDER — COCONUT OIL OIL: 1.0000 "application " | TOPICAL_OIL | Status: DC | PRN

## 2020-12-24 MED ORDER — NALBUPHINE HCL 10 MG/ML IJ SOLN: 5.0000 mg | Freq: Once | INTRAMUSCULAR | Status: DC | PRN

## 2020-12-24 MED ORDER — ACETAMINOPHEN 500 MG PO TABS
1000.0000 mg | ORAL_TABLET | Freq: Four times a day (QID) | ORAL | Status: DC
  Administered 2020-12-25 – 2020-12-27 (×9): 1000 mg via ORAL
  Filled 2020-12-24 (×11): qty 2

## 2020-12-24 MED ORDER — CEFAZOLIN SODIUM-DEXTROSE 2-4 GM/100ML-% IV SOLN
INTRAVENOUS | Status: AC
  Filled 2020-12-24: qty 100

## 2020-12-24 MED ORDER — WITCH HAZEL-GLYCERIN EX PADS: 1.0000 "application " | MEDICATED_PAD | CUTANEOUS | Status: DC | PRN

## 2020-12-24 MED ORDER — DIPHENHYDRAMINE HCL 25 MG PO CAPS: 25.0000 mg | ORAL_CAPSULE | Freq: Four times a day (QID) | ORAL | Status: DC | PRN

## 2020-12-24 MED ORDER — BETAMETHASONE SOD PHOS & ACET 6 (3-3) MG/ML IJ SUSP
12.0000 mg | INTRAMUSCULAR | Status: DC
  Administered 2020-12-24: 12 mg via INTRAMUSCULAR
  Filled 2020-12-24: qty 5

## 2020-12-24 MED ORDER — DEXAMETHASONE SODIUM PHOSPHATE 4 MG/ML IJ SOLN
INTRAMUSCULAR | Status: AC
  Filled 2020-12-24: qty 1

## 2020-12-24 MED ORDER — CALCIUM CARBONATE ANTACID 500 MG PO CHEW: 2.0000 | CHEWABLE_TABLET | ORAL | Status: DC | PRN

## 2020-12-24 MED ORDER — OXYCODONE HCL 5 MG PO TABS: 5.0000 mg | ORAL_TABLET | ORAL | Status: DC | PRN

## 2020-12-24 MED ORDER — ONDANSETRON HCL 4 MG/2ML IJ SOLN: 4.0000 mg | Freq: Three times a day (TID) | INTRAMUSCULAR | Status: DC | PRN

## 2020-12-24 MED ORDER — NALOXONE HCL 0.4 MG/ML IJ SOLN: 0.4000 mg | INTRAMUSCULAR | Status: DC | PRN

## 2020-12-24 MED ORDER — BUPIVACAINE IN DEXTROSE 0.75-8.25 % IT SOLN
INTRATHECAL | Status: DC | PRN
  Administered 2020-12-24: 1.6 mg via INTRATHECAL

## 2020-12-24 MED ORDER — DIPHENHYDRAMINE HCL 50 MG/ML IJ SOLN: 12.5000 mg | INTRAMUSCULAR | Status: DC | PRN

## 2020-12-24 MED ORDER — MEPERIDINE HCL 25 MG/ML IJ SOLN: 6.2500 mg | INTRAMUSCULAR | Status: DC | PRN

## 2020-12-24 MED ORDER — OXYTOCIN-SODIUM CHLORIDE 30-0.9 UT/500ML-% IV SOLN: 2.5000 [IU]/h | INTRAVENOUS | Status: AC

## 2020-12-24 MED ORDER — SOD CITRATE-CITRIC ACID 500-334 MG/5ML PO SOLN: 30.0000 mL | Freq: Once | ORAL | Status: AC

## 2020-12-24 MED ORDER — ZOLPIDEM TARTRATE 5 MG PO TABS: 5.0000 mg | ORAL_TABLET | Freq: Every evening | ORAL | Status: DC | PRN

## 2020-12-24 MED ORDER — NALBUPHINE HCL 10 MG/ML IJ SOLN
5.0000 mg | INTRAMUSCULAR | Status: DC | PRN
Start: 2020-12-24 — End: 2020-12-28

## 2020-12-24 MED ORDER — ACETAMINOPHEN 325 MG PO TABS: 650.0000 mg | ORAL_TABLET | ORAL | Status: DC | PRN

## 2020-12-24 MED ORDER — SENNOSIDES-DOCUSATE SODIUM 8.6-50 MG PO TABS
2.0000 | ORAL_TABLET | Freq: Every day | ORAL | Status: DC
  Administered 2020-12-25 – 2020-12-27 (×3): 2 via ORAL
  Filled 2020-12-24 (×3): qty 2

## 2020-12-24 MED ORDER — IBUPROFEN 600 MG PO TABS
600.0000 mg | ORAL_TABLET | Freq: Four times a day (QID) | ORAL | Status: DC
  Administered 2020-12-25 – 2020-12-27 (×8): 600 mg via ORAL
  Filled 2020-12-24 (×8): qty 1

## 2020-12-24 MED ORDER — TERBUTALINE SULFATE 1 MG/ML IJ SOLN
0.2500 mg | Freq: Once | INTRAMUSCULAR | Status: AC
  Administered 2020-12-24: 0.25 mg via SUBCUTANEOUS

## 2020-12-24 MED ORDER — KETOROLAC TROMETHAMINE 30 MG/ML IJ SOLN
30.0000 mg | Freq: Four times a day (QID) | INTRAMUSCULAR | Status: AC | PRN
  Administered 2020-12-24: 30 mg via INTRAVENOUS

## 2020-12-24 MED ORDER — DEXAMETHASONE SODIUM PHOSPHATE 4 MG/ML IJ SOLN
INTRAMUSCULAR | Status: DC | PRN
  Administered 2020-12-24: 4 mg via INTRAVENOUS

## 2020-12-24 MED ORDER — SCOPOLAMINE 1 MG/3DAYS TD PT72: 1.0000 | MEDICATED_PATCH | Freq: Once | TRANSDERMAL | Status: DC

## 2020-12-24 MED ORDER — KETOROLAC TROMETHAMINE 30 MG/ML IJ SOLN
30.0000 mg | Freq: Four times a day (QID) | INTRAMUSCULAR | Status: AC
  Administered 2020-12-25 (×3): 30 mg via INTRAVENOUS
  Filled 2020-12-24 (×4): qty 1

## 2020-12-24 MED ORDER — PRENATAL MULTIVITAMIN CH
1.0000 | ORAL_TABLET | Freq: Every day | ORAL | Status: DC
  Administered 2020-12-24: 1 via ORAL
  Filled 2020-12-24: qty 1

## 2020-12-24 MED ORDER — TERBUTALINE SULFATE 1 MG/ML IJ SOLN
INTRAMUSCULAR | Status: AC
  Filled 2020-12-24: qty 1

## 2020-12-24 MED ORDER — SIMETHICONE 80 MG PO CHEW
80.0000 mg | CHEWABLE_TABLET | ORAL | Status: DC | PRN
Start: 2020-12-24 — End: 2020-12-28

## 2020-12-24 MED ORDER — ACETAMINOPHEN 10 MG/ML IV SOLN
INTRAVENOUS | Status: DC | PRN
  Administered 2020-12-24: 1000 mg via INTRAVENOUS

## 2020-12-24 MED ORDER — SOD CITRATE-CITRIC ACID 500-334 MG/5ML PO SOLN
ORAL | Status: AC
  Administered 2020-12-24: 30 mL
  Filled 2020-12-24: qty 15

## 2020-12-24 MED ORDER — FENTANYL CITRATE (PF) 100 MCG/2ML IJ SOLN
INTRAMUSCULAR | Status: AC
  Filled 2020-12-24: qty 2

## 2020-12-24 MED ORDER — DOCUSATE SODIUM 100 MG PO CAPS
100.0000 mg | ORAL_CAPSULE | Freq: Every day | ORAL | Status: DC
  Administered 2020-12-24: 100 mg via ORAL
  Filled 2020-12-24: qty 1

## 2020-12-24 MED ORDER — SIMETHICONE 80 MG PO CHEW
80.0000 mg | CHEWABLE_TABLET | Freq: Three times a day (TID) | ORAL | Status: DC
  Administered 2020-12-25 – 2020-12-27 (×6): 80 mg via ORAL
  Filled 2020-12-24 (×6): qty 1

## 2020-12-24 MED ORDER — OXYTOCIN-SODIUM CHLORIDE 30-0.9 UT/500ML-% IV SOLN
INTRAVENOUS | Status: DC | PRN
  Administered 2020-12-24: 300 mL via INTRAVENOUS

## 2020-12-24 MED ORDER — SODIUM CHLORIDE 0.9 % IV SOLN: 250.0000 mL | INTRAVENOUS | Status: DC | PRN

## 2020-12-24 MED ORDER — DIBUCAINE (PERIANAL) 1 % EX OINT: 1.0000 "application " | TOPICAL_OINTMENT | CUTANEOUS | Status: DC | PRN

## 2020-12-24 MED ORDER — FENTANYL CITRATE (PF) 100 MCG/2ML IJ SOLN: 25.0000 ug | INTRAMUSCULAR | Status: DC | PRN

## 2020-12-24 MED ORDER — CEFAZOLIN SODIUM-DEXTROSE 2-3 GM-%(50ML) IV SOLR
INTRAVENOUS | Status: DC | PRN
  Administered 2020-12-24: 2 g via INTRAVENOUS

## 2020-12-24 MED ORDER — FENTANYL CITRATE (PF) 100 MCG/2ML IJ SOLN
INTRAMUSCULAR | Status: DC | PRN
  Administered 2020-12-24: 15 ug via INTRATHECAL

## 2020-12-24 MED ORDER — MORPHINE SULFATE (PF) 0.5 MG/ML IJ SOLN
INTRAMUSCULAR | Status: AC
  Filled 2020-12-24: qty 10

## 2020-12-24 SURGICAL SUPPLY — 37 items
BENZOIN TINCTURE PRP APPL 2/3 (GAUZE/BANDAGES/DRESSINGS) ×2 IMPLANT
CHLORAPREP W/TINT 26ML (MISCELLANEOUS) ×2 IMPLANT
CLAMP CORD UMBIL (MISCELLANEOUS) IMPLANT
CLOSURE STERI STRIP 1/2 X4 (GAUZE/BANDAGES/DRESSINGS) ×1 IMPLANT
CLOTH BEACON ORANGE TIMEOUT ST (SAFETY) ×2 IMPLANT
DRSG OPSITE POSTOP 4X10 (GAUZE/BANDAGES/DRESSINGS) ×2 IMPLANT
ELECT REM PT RETURN 9FT ADLT (ELECTROSURGICAL) ×2
ELECTRODE REM PT RTRN 9FT ADLT (ELECTROSURGICAL) ×1 IMPLANT
EXTRACTOR VACUUM KIWI (MISCELLANEOUS) ×2 IMPLANT
GLOVE BIOGEL PI IND STRL 6.5 (GLOVE) ×1 IMPLANT
GLOVE BIOGEL PI IND STRL 7.0 (GLOVE) ×2 IMPLANT
GLOVE BIOGEL PI INDICATOR 6.5 (GLOVE) ×1
GLOVE BIOGEL PI INDICATOR 7.0 (GLOVE) ×2
GLOVE ECLIPSE 6.0 STRL STRAW (GLOVE) ×2 IMPLANT
GOWN STRL REUS W/TWL LRG LVL3 (GOWN DISPOSABLE) ×4 IMPLANT
KIT ABG SYR 3ML LUER SLIP (SYRINGE) IMPLANT
NDL HYPO 25X5/8 SAFETYGLIDE (NEEDLE) IMPLANT
NEEDLE HYPO 25X5/8 SAFETYGLIDE (NEEDLE) IMPLANT
NS IRRIG 1000ML POUR BTL (IV SOLUTION) ×2 IMPLANT
PACK C SECTION WH (CUSTOM PROCEDURE TRAY) ×2 IMPLANT
PAD OB MATERNITY 4.3X12.25 (PERSONAL CARE ITEMS) ×2 IMPLANT
PENCIL SMOKE EVAC W/HOLSTER (ELECTROSURGICAL) ×2 IMPLANT
RETRACTOR WND ALEXIS 25 LRG (MISCELLANEOUS) IMPLANT
RTRCTR WOUND ALEXIS 25CM LRG (MISCELLANEOUS)
STRIP CLOSURE SKIN 1/2X4 (GAUZE/BANDAGES/DRESSINGS) IMPLANT
SUT MNCRL 0 VIOLET CTX 36 (SUTURE) ×2 IMPLANT
SUT MONOCRYL 0 CTX 36 (SUTURE) ×2
SUT PLAIN 2 0 XLH (SUTURE) IMPLANT
SUT VIC AB 0 CT1 36 (SUTURE) ×4 IMPLANT
SUT VIC AB 2-0 CT1 (SUTURE) ×1 IMPLANT
SUT VIC AB 3-0 CT1 27 (SUTURE)
SUT VIC AB 3-0 CT1 TAPERPNT 27 (SUTURE) IMPLANT
SUT VIC AB 4-0 KS 27 (SUTURE) ×1 IMPLANT
SUT VIC AB 4-0 PS2 27 (SUTURE) ×2 IMPLANT
TOWEL OR 17X24 6PK STRL BLUE (TOWEL DISPOSABLE) ×2 IMPLANT
TRAY FOLEY W/BAG SLVR 14FR LF (SET/KITS/TRAYS/PACK) IMPLANT
WATER STERILE IRR 1000ML POUR (IV SOLUTION) ×2 IMPLANT

## 2020-12-24 NOTE — MAU Provider Note (Signed)
Chief Complaint:  Rupture of Membranes   Event Date/Time   First Provider Initiated Contact with Patient 12/24/20 [id number]     HPI: Megan Mclaughlin is a 35 y.o. G1P0 at 20w5dwho presents to maternity admissions reporting leaking fluid.  Has some contractions but they are not painful.. She reports good fetal movement, denies vaginal bleeding, vaginal itching/burning, urinary symptoms, h/a, dizziness, n/v, diarrhea, constipation or fever/chills.   Vaginal Discharge The patient's primary symptoms include vaginal discharge. The patient's pertinent negatives include no genital itching, genital lesions, genital odor, pelvic pain or vaginal bleeding. This is a new problem. The current episode started today. The problem occurs constantly. The problem has been unchanged. The pain is mild. She is pregnant. Pertinent negatives include no chills, constipation, diarrhea, fever, nausea or vomiting. The vaginal discharge was watery and clear. There has been no bleeding. She has not been passing clots. She has not been passing tissue. Nothing aggravates the symptoms. She has tried nothing for the symptoms.     Past Medical History: Past Medical History:  Diagnosis Date  . Medical history non-contributory     Past obstetric history: OB History  Gravida Para Term Preterm AB Living  1            SAB IAB Ectopic Multiple Live Births               # Outcome Date GA Lbr Len/2nd Weight Sex Delivery Anes PTL Lv  1 Current             Past Surgical History: Past Surgical History:  Procedure Laterality Date  . NM RENAL LASIX ([place] HX)  2011    Family History: Family History  Problem Relation Age of Onset  . Asthma Mother   . Diabetes Father   . Hypertension Father   . Cancer Father     Social History: Social History   Tobacco Use  . Smoking status: Never Smoker  . Smokeless tobacco: Never Used  Substance Use Topics  . Alcohol use: Never  . Drug use: Never    Allergies: No Known  Allergies  Meds:  Medications Prior to Admission  Medication Sig Dispense Refill Last Dose  . cholecalciferol (VITAMIN D3) 25 MCG (1000 UNIT) tablet Take 1,000 Units by mouth daily.   12/23/2020 at Unknown time  . Prenatal Vit-Fe Fumarate-FA (MULTIVITAMIN-PRENATAL) 27-0.8 MG TABS tablet Take by mouth daily at 12 noon.   12/23/2020 at Unknown time  . oxyCODONE-acetaminophen (PERCOCET/ROXICET) 5-325 MG tablet Take 1 tablet by mouth every 4 (four) hours as needed for severe pain. 20 tablet 0 More than a month at Unknown time    I have reviewed patient's Past Medical Hx, Surgical Hx, Family Hx, Social Hx, medications and allergies.   ROS:  Review of Systems  Constitutional: Negative for chills and fever.  Gastrointestinal: Negative for constipation, diarrhea, nausea and vomiting.  Genitourinary: Positive for vaginal discharge. Negative for pelvic pain.   Other systems negative  Physical Exam   Patient Vitals for the past 24 hrs:  BP Temp Temp src Pulse Resp Height Weight  12/24/20 0616 128/81 98 F (36.7 C) Oral (!) 115 20 5\' 3"  (1.6 m) 95 kg   Constitutional: Well-developed, well-nourished female in no acute distress.  Cardiovascular: normal rate and rhythm Respiratory: normal effort, clear to auscultation bilaterally GI: Abd soft, non-tender, gravid appropriate for gestational age.   No rebound or guarding. MS: Extremities nontender, no edema, normal ROM Neurologic: Alert and oriented x 4.  GU: Neg  CVAT.  PELVIC EXAM: Grossly ruptured per RN report,  Cx exam deferred                             + ferning  FHT:  Baseline 140 , moderate variability, accelerations present, small variable  Decelerations with contractions Contractions: q 7 mins Irregular     Labs: Results for orders placed or performed during the hospital encounter of 12/24/20 (from the past 24 hour(s))  Type and screen [place]     Status: None (Preliminary result)   Collection Time: 12/24/20   7:01 AM  Result Value Ref Range   ABO/RH(D) PENDING    Antibody Screen PENDING    Sample Expiration      12/27/2020,2359 Performed at [facility], [place] [address], [place], [place] [address]        Imaging:  No results found.  MAU Course/MDM: I have ordered labs and [place] for presentation NST reviewed, reassuring but has small variable decels with UCs Consult Dr Ronita Hipps with presentation, exam findings and test results.  Treatments in MAU included EFM Will admit to [facility].    Assessment: Single IUP at [redacted]w[redacted]d PPROM Irregular contractions  Plan: Admit to [facility] unit Dr Ronita Hipps to follow  Hansel Feinstein CNM, MSN Certified Nurse-Midwife 12/24/2020 6:59 AM

## 2020-12-24 NOTE — Progress Notes (Signed)
Pt transported to OR for csection by Dr. Murrell Redden.

## 2020-12-24 NOTE — Anesthesia Postprocedure Evaluation (Signed)
Anesthesia Post Note  Patient: Megan Mclaughlin  Procedure(s) Performed: CESAREAN SECTION (N/A )     Patient location during evaluation: PACU Anesthesia Type: Spinal Level of consciousness: oriented and awake and alert Pain management: pain level controlled Vital Signs Assessment: post-procedure vital signs reviewed and stable Respiratory status: spontaneous breathing, respiratory function stable and patient connected to nasal cannula oxygen Cardiovascular status: blood pressure returned to baseline and stable Postop Assessment: no headache, no backache and no apparent nausea or vomiting Anesthetic complications: no   No complications documented.  Last Vitals:  Vitals:   12/24/20 1945 12/24/20 2007  BP: 121/70 114/61  Pulse:  (!) 113  Resp:  18  Temp:  (!) 36.4 C  SpO2:  100%    Last Pain:  Vitals:   12/24/20 2007  TempSrc: Oral  PainSc: 0-No pain   Pain Goal:                   Doni Widmer

## 2020-12-24 NOTE — Lactation Note (Signed)
This note was copied from a baby's chart. Lactation Consultation Note  Patient Name: Megan Mclaughlin [id number] Date: 12/24/2020 Reason for consult: Initial assessment;Mother's request;Primapara;1st time breastfeeding;Preterm <34wks;Infant < 6lbs;NICU baby Age:33 hours LC called to see Mom as per her request. She wanted to get pumping before going to the NICU. Mom has a Advertising account executive pump at home.   [place] set her on on DEBP and went over parts, assembly, cleaning and milk storage. Mom to alert RN to label and transfer any EBM to NICU.  NICU book provided.  [facility] brochure provided, reviewed inpatient and outpatient services.   Maternal Data    Feeding Mother's Current Feeding Choice: Breast Milk and Donor Milk  LATCH Score                    Lactation Tools Discussed/Used Tools: Pump;Flanges Flange Size: 24 Breast pump type: Double-Electric Breast Pump Pump Education: Setup, frequency, and cleaning;Milk Storage Reason for Pumping: increase stimulation Pumping frequency: every 3 hrs for 59minutes  Interventions Interventions: Breast feeding basics reviewed;DEBP;Education;Expressed milk  Discharge Pump: Personal WIC Program: No  Consult Status Consult Status: Follow-up Date: 12/25/20 Follow-up type: In-patient    Ceairra Mccarver  N[place] 12/24/2020, 9:03 PM

## 2020-12-24 NOTE — Anesthesia Preprocedure Evaluation (Signed)
Anesthesia Evaluation  Patient identified by MRN, date of birth, ID band Patient awake    Reviewed: Allergy & Precautions, NPO status , Patient's Chart, lab work & pertinent test results  History of Anesthesia Complications Negative for: history of anesthetic complications  Airway Mallampati: II   Neck ROM: Full    Dental   Pulmonary neg pulmonary ROS,    Pulmonary exam normal        Cardiovascular negative cardio ROS Normal cardiovascular exam     Neuro/Psych negative neurological ROS  negative psych ROS   GI/Hepatic negative GI ROS, Neg liver ROS,   Endo/Other   Obesity   Renal/GU negative Renal ROS     Musculoskeletal negative musculoskeletal ROS (+)   Abdominal   Peds  Hematology negative hematology ROS (+)  Plt 254k    Anesthesia Other Findings Covid test negative   Reproductive/Obstetrics (+) Pregnancy                             Anesthesia Physical Anesthesia Plan  ASA: II  Anesthesia Plan: Spinal   Post-op Pain Management:    Induction:   PONV Risk Score and Plan: 2 and Treatment may vary due to age or medical condition, Ondansetron and Scopolamine patch - Pre-op  Airway Management Planned: Natural Airway  Additional Equipment: None  Intra-op Plan:   Post-operative Plan:   Informed Consent: I have reviewed the patients History and Physical, chart, labs and discussed the procedure including the risks, benefits and alternatives for the proposed anesthesia with the patient or authorized representative who has indicated his/her understanding and acceptance.       Plan Discussed with: CRNA and Anesthesiologist  Anesthesia Plan Comments: (Labs reviewed, platelets acceptable. Discussed risks and benefits of spinal, including spinal/epidural hematoma, infection, failed block, and PDPH. Patient expressed understanding and wished to proceed. )         Anesthesia Quick Evaluation

## 2020-12-24 NOTE — Consult Note (Signed)
[facility]  Prenatal Consult       12/24/2020  3:46 PM   I was asked by Ronita Hipps to consult on this patient for possible preterm delivery.  I had the pleasure of meeting with Megan Mclaughlin and her husband today.  She is a 35 yo G1 with previously uncomplicated pregnancy until she presented today with SROM.  She initially had no signs of labor and plan was to was give betamethasone x 48 hours prior to augmenting labor.  She has recently started to have painful contractions.  I explained that the neonatal intensive care team would be present for the delivery and outlined the likely delivery room course for this baby including routine resuscitation and NRP-guided approaches to the treatment of respiratory distress. We discussed other common problems associated with prematurity including respiratory distress syndrome/CLD, apnea, feeding issues, temperature regulation, and infection risk.    We discussed the average length of stay but I noted that the actual LOS would depend on the severity of problems encountered and response to treatments.  We discussed visitation policies and the resources available while her child is in the hospital.  We discussed the importance of good nutrition and various methods of providing nutrition (parenteral hyperalimentation, gavage feedings and/or oral feeding). We discussed the benefits of human milk. I encouraged breast feeding and pumping soon after birth and outlined resources that are available to support breast feeding.   Thank you for involving [place] in the care of this patient. A member of our team will be available should the family have additional questions.  Time for consultation approximately 20 minutes.   _____________________ Electronically Signed By: Towana Badger, MD, MS Neonatologist

## 2020-12-24 NOTE — MAU Note (Addendum)
PT SAYS SROM AT 0445- AFTER GOING TO B-ROOM- HAD A GUSH  PNC WITH  DR MODY    NO VE IN OFFICE  NO GBS COLLECTED   LAST SEX- 3 MTHS AGO. NO UC'S

## 2020-12-24 NOTE — Progress Notes (Signed)
Nursing informed of prolonged deceleration (call initially to Dr Ronita Hipps); per nursing: pt was sitting and eating when noted variable/decel, then while FHT was recovering, had hard time getting fht back on monitor, position changes, pulse ox applied for maternal pulse, FHT then back to baseline  Pt denies painful contractions, vb +FM  Patient Vitals for the past 24 hrs:  BP Temp Temp src Pulse Resp SpO2 Height Weight  12/24/20 1153 126/73 98.7 F (37.1 C) Oral (!) 106 18 99 % -- --  12/24/20 0825 118/75 97.9 F (36.6 C) Oral (!) 101 -- 100 % -- --  12/24/20 0616 128/81 98 F (36.7 C) Oral (!) 115 20 -- 5\' 3"  (1.6 m) 95 kg   A&ox3 nml respirations Abd: soft, nt, gravid Cx: no exam this admission so exam performed; pt on bedpan, SSE: pooling noted and appeared about 1cm but pt did not tolerate well, cx exam done and 1/30/-4, posterior  FHT: 140s, nml variability, then decel to 90s for about 20-30 sec then up to 120s, then poor tracing of fht but appears about 120s, then after about 8-9 min in total, quick jump to 140s; variability is wnl and there have also been accelerations TOCO: infrequent, about 2-4 an hr, pt doesn't feel all ctx  Growth u/s:  Cephalic,efw 5'9" (6812X),51%; afi 9.1cm; bpd 39%, hc 8%, ac 93%, FL 1.5%  A/P: iup at 34.5 wga 1. Fetal status reassuring now but will follow closely; ok to contin plan now for observation but if prolonged decels persist will need to consider move to delivery; plan continuous monitoring, currently getting iv bolus and will contin with iv hydration; plan for second bmz dose in am, continue monitoring for s/s chorioamnionitis/labor; if pt stable plan for IOL likely Friday with Dr Benjie Karvonen; u/s findings also reviewed with the patient and husband 2. Fibroid uterus 3. gbs pending

## 2020-12-24 NOTE — Op Note (Signed)
Cesarean Section Procedure Note   Megan Mclaughlin  12/24/2020  Indications: Fetal Distress   Pre-operative Diagnosis: fetal heart rate intolerance to contractions; pprom, preterm   Post-operative Diagnosis: Same   Surgeon: Surgeon(s) and Role:    * Mykelti Goldenstein, Earlyne Iba, MD - Primary   Assistants: Gavin Potters, CNM  Anesthesia: spinal   Procedure Details:  The patient was seen in the Holding Room. The risks, benefits, complications, treatment options, and expected outcomes were discussed with the patient. The patient concurred with the proposed plan, giving informed consent. identified as Megan Mclaughlin and the procedure verified as C-Section Delivery. A Time Out was held and the above information confirmed.  After induction of anesthesia, the patient was draped and prepped in the usual sterile manner, foley was draining urine well.  A pfannenstiel incision was made and carried down through the subcutaneous tissue to the fascia. Fascial incision was made and extended transversely. The fascia was separated from the underlying rectus tissue superiorly and inferiorly. The peritoneum was identified and entered. Peritoneal incision was extended longitudinally. Alexis-O retractor placed. The utero-vesical peritoneal reflection was incised transversely and the bladder flap was bluntly freed from the lower uterine segment. A low transverse uterine incision was made. Delivered from cephalic presentation was a viable female infant with vigorous cry. Apgar scores of 8 at one minute and 9 at five minutes. Delayed cord clamping done at 1 minute and baby handed to NICU team in attendance. Cord ph was not sent. Cord blood was obtained for evaluation. The placenta was removed Intact and appeared normal. The uterine outline, tubes and ovaries appeared normal}. The uterine incision was closed with running locked sutures of monocryl. A second imbricating layer was sutured.   Hemostasis was observed. Alexis retractor  removed. Peritoneal closure done with 2-0 Vicryl.  The fascia was then reapproximated with running sutures of 0Vicryl. The subcuticular closure was hemostatic. The skin was closed with 4-0Vicryl.   Instrument, sponge, and needle counts were correct prior the abdominal closure and were correct at the conclusion of the case.    Findings: viable female infant, apgars 8/9; nml uterus/tubes/ovaries   Estimated Blood Loss: 102 per titron   Total IV Fluids: 1014ml   Urine Output: 300CC OF clear urine  Specimens: to pathology   Complications: no complications  Disposition: PACU - hemodynamically stable.   Maternal Condition: stable   Baby condition / location:  NICU  Attending Attestation: I performed the procedure.   Signed: Surgeon(s): Murrell Redden Earlyne Iba, MD

## 2020-12-24 NOTE — Transfer of Care (Signed)
Immediate Anesthesia Transfer of Care Note  Patient: Megan Mclaughlin  Procedure(s) Performed: CESAREAN SECTION (N/A )  Patient Location: PACU  Anesthesia Type:Spinal  Level of Consciousness: awake, alert  and oriented  Airway & Oxygen Therapy: Patient Spontanous Breathing  Post-op Assessment: Report given to RN and Post -op Vital signs reviewed and stable  Post vital signs: Reviewed and stable  Last Vitals:  Vitals Value Taken Time  BP 109/63 12/24/20 1845  Temp 36.4 C 12/24/20 1845  Pulse 108 12/24/20 1855  Resp 19 12/24/20 1855  SpO2 99 % 12/24/20 1855  Vitals shown include unvalidated device data.  Last Pain:  Vitals:   12/24/20 1153  TempSrc: Oral  PainSc:          Complications: No complications documented.

## 2020-12-24 NOTE — Progress Notes (Signed)
Called by nurse regarding FHT, recurrent late decelerations and prolonged deceleration  Pt c/o now feeling her contractions, painful  Patient Vitals for the past 24 hrs:  BP Temp Temp src Pulse Resp SpO2 Height Weight  12/24/20 1153 126/73 98.7 F (37.1 C) Oral (!) 106 18 99 % -- --  12/24/20 0825 118/75 97.9 F (36.6 C) Oral (!) 101 -- 100 % -- --  12/24/20 0616 128/81 98 F (36.7 C) Oral (!) 115 20 -- 5\' 3"  (1.6 m) 95 kg   A&ox3 nml respirations Abd: soft, nt, gravid Pelvic deferred  FHT: 130s with normal variability then recurrent late decelerations about 1 min, then prolonged 3 min decel, recovered then other 1 min late and other 3-4 min decel; contractions are irregular but decels with most of ctx; did recover after last 3-4 min decel and then normal variability with accel noted; then contraction again with decel; at this time terbutaline given Toco: irregular, resolved after turbutatline  A/P: iup at 34.5 wga 1. NRFHT in presence of srom- I have reviewed the repetitive nature of the late decelerations along with the prolonged decelerations; this is in light of the prolonged decel of about 8-9 min within the last few hours; in addition to moving towards delivery, the fetus is not tolerating contractions and therefore unable to proceed for IOL; at this time I recommend c/s; patient and husband agree with no questions; risks of bleeding, infection, risk of injury to bowel, bladder, nerves, blood vessels, risk of further surgery, risk of blood clot to legs/lungs; risk of anesthesia; NICU has already been consulted in patient care and will be called for delivery

## 2020-12-24 NOTE — H&P (Addendum)
Megan Mclaughlin is a 35 y.o. female presenting for SROM at 0445 this am. Denies contractions or bleeding. Good FM.  OB History    Gravida  1   Para      Term      Preterm      AB      Living        SAB      IAB      Ectopic      Multiple      Live Births             Past Medical History:  Diagnosis Date  . Medical history non-contributory    Past Surgical History:  Procedure Laterality Date  . NM RENAL LASIX ([facility] HX)  2011   Family History: family history includes Asthma in her mother; Cancer in her father; Diabetes in her father; Hypertension in her father. Social History:  reports that she has never smoked. She has never used smokeless tobacco. She reports that she does not drink alcohol and does not use drugs.     Maternal Diabetes: No Genetic Screening: Normal Maternal Ultrasounds/Referrals: Normal Fetal Ultrasounds or other Referrals:  None Maternal Substance Abuse:  No Significant Maternal Medications:  None Significant Maternal Lab Results:  Other: GBS pending Other Comments:  None  Review of Systems  Constitutional: Negative.   All other systems reviewed and are negative.  Maternal Medical History:  Reason for admission: Rupture of membranes.   Contractions: Onset was less than 1 hour ago.   Frequency: rare.   Perceived severity is mild.    Fetal activity: Perceived fetal activity is normal.   Last perceived fetal movement was within the past hour.    Prenatal complications: no prenatal complications Prenatal Complications - Diabetes: none.      Blood pressure 128/81, pulse (!) 115, temperature 98 F (36.7 C), temperature source Oral, resp. rate 20, height 5\' 3"  (1.6 m), weight 95 kg. Maternal Exam:  Uterine Assessment: Contraction strength is mild.  Contraction frequency is rare.   Abdomen: Patient reports no abdominal tenderness. Fetal presentation: vertex  Introitus: Normal vulva. Normal vagina.  Ferning test: positive.   Nitrazine test: positive.  Pelvis: adequate for delivery.   Cervix: Cervix evaluated by digital exam.     Physical Exam Constitutional:      Appearance: Normal appearance. She is normal weight.  HENT:     Head: Normocephalic and atraumatic.  Cardiovascular:     Rate and Rhythm: Normal rate and regular rhythm.     Pulses: Normal pulses.     Heart sounds: Normal heart sounds.  Pulmonary:     Effort: Pulmonary effort is normal.     Breath sounds: Normal breath sounds.  Abdominal:     General: Bowel sounds are normal.     Palpations: Abdomen is soft.  Genitourinary:    General: Normal vulva.  Musculoskeletal:        General: Normal range of motion.     Cervical back: Normal range of motion and neck supple.  Skin:    General: Skin is warm and dry.  Neurological:     General: No focal deficit present.     Mental Status: She is alert and oriented to person, place, and time.  Psychiatric:        Mood and Affect: Mood normal.        Behavior: Behavior normal.     Prenatal labs: ABO, Rh: --/--/PENDING (05/24 0701) Antibody: PENDING (05/24 0701) Rubella:  IMM RPR:   neg HBsAg:   neg HIV:   neg GBS:   pending  Category 1 tracing. 140-150s. BTBV 5-25. Rare mild variable. Irregular contractions.  CBC    Component Value Date/Time   WBC 12.0 (H) 12/24/2020 0700   WBC 11.7 (H) 12/24/2020 0700   RBC 4.65 12/24/2020 0700   RBC 4.71 12/24/2020 0700   HGB 12.3 12/24/2020 0700   HGB 12.5 12/24/2020 0700   HCT 38.6 12/24/2020 0700   HCT 39.0 12/24/2020 0700   PLT 247 12/24/2020 0700   PLT 254 12/24/2020 0700   MCV 83.0 12/24/2020 0700   MCV 82.8 12/24/2020 0700   MCH 26.5 12/24/2020 0700   MCH 26.5 12/24/2020 0700   MCHC 31.9 12/24/2020 0700   MCHC 32.1 12/24/2020 0700   RDW 15.3 12/24/2020 0700   RDW 15.2 12/24/2020 0700   LYMPHSABS 2.1 12/24/2020 0700   MONOABS 0.9 12/24/2020 0700   EOSABS 0.1 12/24/2020 0700   BASOSABS 0.0 12/24/2020 0700      Assessment/Plan: 34w 5d IUP PPROM Uterine fibroids  Admit Expectant management discussed. Will do BMZ course GBS pending Vertex by limited sono Monitor s/s chorio   Megan Mclaughlin J 12/24/2020, 7:58 AM

## 2020-12-24 NOTE — Anesthesia Procedure Notes (Signed)
Spinal  Patient location during procedure: OR Start time: 12/24/2020 5:19 PM End time: 12/24/2020 5:22 PM Reason for block: surgical anesthesia Staffing Performed: anesthesiologist  Anesthesiologist: Audry Pili, MD Preanesthetic Checklist Completed: patient identified, IV checked, risks and benefits discussed, surgical consent, monitors and equipment checked, pre-op evaluation and timeout performed Spinal Block Patient position: sitting Prep: DuraPrep Patient monitoring: heart rate, cardiac monitor, continuous pulse ox and blood pressure Approach: midline Location: L3-4 Injection technique: single-shot Needle Needle type: Pencan  Needle gauge: 24 G Additional Notes Consent was obtained prior to the procedure with all questions answered and concerns addressed. Risks including, but not limited to, bleeding, infection, nerve damage, paralysis, failed block, inadequate analgesia, allergic reaction, high spinal, itching, and headache were discussed and the patient wished to proceed. Functioning IV was confirmed and monitors were applied. Sterile prep and drape, including hand hygiene, mask, and sterile gloves were used. The patient was positioned and the spine was prepped. The skin was anesthetized with lidocaine. Free flow of clear CSF was obtained prior to injecting local anesthetic into the CSF. The spinal needle aspirated freely following injection. The needle was carefully withdrawn. The patient tolerated the procedure well.   Renold Don, MD

## 2020-12-25 LAB — CBC
HCT: 34.7 % — ABNORMAL LOW (ref 36.0–46.0)
Hemoglobin: 11.1 g/dL — ABNORMAL LOW (ref 12.0–15.0)
MCH: 26.6 pg (ref 26.0–34.0)
MCHC: 32 g/dL (ref 30.0–36.0)
MCV: 83.2 fL (ref 80.0–100.0)
Platelets: 233 10*3/uL (ref 150–400)
RBC: 4.17 MIL/uL (ref 3.87–5.11)
RDW: 15.2 % (ref 11.5–15.5)
WBC: 17.8 10*3/uL — ABNORMAL HIGH (ref 4.0–10.5)
nRBC: 0 % (ref 0.0–0.2)

## 2020-12-25 LAB — RUBELLA SCREEN: Rubella: 1.88 index (ref >0.99)

## 2020-12-25 LAB — RPR: RPR Ser Ql: NONREACTIVE

## 2020-12-25 NOTE — Lactation Note (Signed)
This note was copied from a baby's chart. Lactation Consultation Note LC to mother's room for f/u visit. She is pumping q3. I reinforced earlier teaching and answered questions regarding pumping and IDF. Will plan f/u visit in NICU.   Patient Name: Megan Mclaughlin [id number] Date: 12/25/2020 Reason for consult: NICU baby;Follow-up assessment Age:33 hours   Feeding Mother's Current Feeding Choice: Breast Milk and Donor Milk   Interventions Interventions: Breast feeding basics reviewed;Education;DEBP  Consult Status Consult Status: Follow-up Follow-up type: [place], [place] IBCLC 12/25/2020, 12:06 PM

## 2020-12-25 NOTE — Lactation Note (Signed)
This note was copied from a baby's chart. Lactation Consultation Note LC to infant's room at RN request for latch assistance. Baby did not wake. Reviewed normalcy and scheduled f/u visit to further assist.  Patient Name: Megan Mclaughlin UYQIH'[id number] Date: 12/25/2020 Reason for consult: NICU baby;Follow-up assessment Age:33 hours   Feeding Mother's Current Feeding Choice: Breast Milk and Donor Milk   Interventions Interventions: Breast feeding basics reviewed;Education;DEBP  Consult Status Consult Status: Follow-up Follow-up type: [place], [place] IBCLC 12/25/2020, 3:33 PM

## 2020-12-25 NOTE — Progress Notes (Signed)
Subjective: POD# 1 Live born female  Birth Weight: 5 lb 6.8 oz (2460 g) APGAR: 8, 9  Newborn Delivery   Birth date/time: 12/24/2020 17:52:00 Delivery type: C-Section, Low Transverse Trial of labor: No C-section categorization: Primary     Baby name: undecided Delivering provider: Tiana Loft E    Feeding: breast; pumping while baby in NICU  Pain control at delivery: Spinal   Reports feeling well.    Patient reports tolerating PO.   Breast symptoms:none  Pain is tolerable and controlled with Toradol and ibuprofen  Denies HA/SOB/C/P/N/V/dizziness. Flatus present  She reports vaginal bleeding as normal, without clots.  She is ambulating, urinating without difficulty.     Objective:   VS:    Vitals:   12/25/20 0102 12/25/20 0341 12/25/20 0742 12/25/20 1106  BP: (!) 108/45 (!) 100/55 (!) 105/56 106/72  Pulse: 98 78 82 79  Resp: 16 16 17 18   Temp: 98.3 F (36.8 C) 98 F (36.7 C) 97.8 F (36.6 C) 97.8 F (36.6 C)  TempSrc: Oral Oral Oral Oral  SpO2:  100% 100% 100%  Weight:      Height:          Intake/Output Summary (Last 24 hours) at 12/25/2020 1155 Last data filed at 12/25/2020 1108 Gross per 24 hour  Intake 2520.11 ml  Output 3154 ml  Net -633.89 ml        Recent Labs    12/24/20 0700 12/25/20 0649  WBC 11.7*  12.0* 17.8*  HGB 12.5  12.3 11.1*  HCT 39.0  38.6 34.7*  PLT 254  247 233     Blood type: --/--/O POS (05/24 0700)  Rubella: 1.88 (05/24 0700)  Vaccines: TDaP          UTD         Flu             UTD                    COVID-19 UTD   Physical Exam:  General: alert, cooperative and no distress CV: Regular rate and rhythm or S1S2 present Resp: clear and unlabored  Abdomen: soft, non-tender, bowel sounds present in all 4 quadrants  Incision: dry and intact,  Uterine Fundus: firm, below umbilicus, tender Lochia: minimal OZD:[id number] non-pitting edema present. No signs of erythema or DVT. SCDs on.    Assessment/Plan: 35 y.o.    POD# 1. [id number]                  Principal Problem:   Postpartum care following cesarean delivery 5/24  -Continue with routine post/op care. Encouraged frequent ambulation for gut motility and soreness. Continue with oral Ibuprofen and begin tylenol regimen for pain management PRN. Also discussed continued use of in hospital [facility] resources.   Active Problems:   Preterm premature rupture of membranes (PPROM) with unknown onset of labor   Status post primary low transverse cesarean section NRFHTs  -Stable status.   Waldemar Siegel Isaias Sakai) Rollene Rotunda, BSN, RNC-OB  Student Nurse-Midwife   12/25/2020  12:14 PM

## 2020-12-25 NOTE — Progress Notes (Signed)
Patient screened out for psychosocial assessment since none of the following apply: °Psychosocial stressors documented in mother or baby's chart °Gestation less than 32 weeks °Code at delivery  °Infant with anomalies °Please contact the Clinical Social Worker if specific needs arise, by MOB's request, or if MOB scores greater than 9/yes to question 10 on [place] Postpartum Depression Screen. ° °Makayleigh Poliquin Boyd-Gilyard, MSW, LCSW °Clinical Social Work °[phone] °  °

## 2020-12-26 ENCOUNTER — Encounter (HOSPITAL_COMMUNITY): Payer: Self-pay | Admitting: Obstetrics and Gynecology

## 2020-12-26 LAB — CULTURE, BETA STREP (GROUP B ONLY)

## 2020-12-26 NOTE — Lactation Note (Signed)
This note was copied from a baby's chart. Lactation Consultation Note  Patient Name: [name] IEPPI'R Date: 12/26/2020 Reason for consult: Follow-up assessment;Primapara;1st time breastfeeding;NICU baby;Late-preterm 34-36.6wks Age:33 hours   I followed up with Megan Mclaughlin to check on her progress. She is pumping consistently and obtaining a few mls with each pump. I provided praise and encouragement. I reinforced pumping education. I recommended that she take her kit to the NICU with her today if she plans to stay with baby for a while.  She anticipates her discharge to be Friday.   Maternal Data Does the patient have breastfeeding experience prior to this delivery?: No  Feeding Mother's Current Feeding Choice: Breast Milk and Donor Milk    Lactation Tools Discussed/Used Tools: Pump;Other (comment) (storage bottles; cleaning supplies) Breast pump type: Double-Electric Breast Pump Pump Education: Setup, frequency, and cleaning Reason for Pumping: NICU Pumping frequency: q 3 hours Pumped volume: 3 mL (mls)  Interventions Interventions: Breast feeding basics reviewed;Education  Discharge    Consult Status Consult Status: Follow-up Date: 12/27/20 Follow-up type: In-patient    Megan Mclaughlin 12/26/2020, 10:29 AM

## 2020-12-26 NOTE — Progress Notes (Signed)
Subjective: POD# 2 Live born female  Birth Weight: 5 lb 6.8 oz (2460 g) APGAR: 8, 9 NICU admit for prematurity  Newborn Delivery   Birth date/time: 12/24/2020 17:52:00 Delivery type: C-Section, Low Transverse Trial of labor: No C-section categorization: Primary     Baby name: Undecided Delivering provider: Tiana Loft E   Feeding: breast / pumping  Pain control at delivery: Spinal   Reports feeling well  Patient reports tolerating PO.   Breast symptoms: + colostrum Pain controlled with PO meds Denies HA/SOB/C/P/N/V/dizziness. Flatus present. She reports vaginal bleeding as normal, without clots.  She is ambulating, urinating without difficulty.     Objective:   VS:    Vitals:   12/25/20 1447 12/25/20 1920 12/26/20 0408 12/26/20 0752  BP: 122/72 118/75 115/70 108/67  Pulse: 78 85 92 84  Resp: 17 16 18 17   Temp: 97.9 F (36.6 C) 97.8 F (36.6 C) 97.6 F (36.4 C) 97.8 F (36.6 C)  TempSrc: Oral Oral Oral Oral  SpO2: 100% 97% 99% 99%  Weight:      Height:          Intake/Output Summary (Last 24 hours) at 12/26/2020 0820 Last data filed at 12/25/2020 1108 Gross per 24 hour  Intake --  Output 500 ml  Net -500 ml        Recent Labs    12/24/20 0700 12/25/20 0649  WBC 11.7*  12.0* 17.8*  HGB 12.5  12.3 11.1*  HCT 39.0  38.6 34.7*  PLT 254  247 233     Blood type: --/--/O POS (05/24 0700)  Rubella: 1.88 (05/24 0700)  Vaccines: TDaP          UTD         Flu             UTD                    COVID-19 UTD   Physical Exam:  General: alert, cooperative and no distress Abdomen: soft, nontender, normal bowel sounds Incision: clean, dry and intact Uterine Fundus: firm, below umbilicus, nontender Lochia: minimal Ext: edema +1 pedal  Assessment/Plan: 35 y.o.   POD# 2. [id number]                  Principal Problem:   Postpartum care following cesarean delivery 5/24 Active Problems:   Preterm premature rupture of membranes (PPROM) with unknown  onset of labor   Status post primary low transverse cesarean section NRFHTs   Doing well, stable.    LC support for NICU infant Routine post-op care Anticipate DC tomorrow.   Juliene Pina, CNM, MSN 12/26/2020, 8:20 AM

## 2020-12-26 NOTE — Plan of Care (Signed)
  Problem: Education: Goal: Knowledge of disease or condition will improve Outcome: Progressing Goal: Knowledge of the prescribed therapeutic regimen will improve Outcome: Progressing Goal: Individualized Educational Video(s) Outcome: Progressing   Problem: Clinical Measurements: Goal: Complications related to the disease process, condition or treatment will be avoided or minimized Outcome: Progressing   Problem: Education: Goal: Knowledge of General Education information will improve Description: Including pain rating scale, medication(s)/side effects and non-pharmacologic comfort measures Outcome: Progressing   Problem: Health Behavior/Discharge Planning: Goal: Ability to manage health-related needs will improve Outcome: Progressing   Problem: Clinical Measurements: Goal: Ability to maintain clinical measurements within normal limits will improve Outcome: Progressing Goal: Will remain free from infection Outcome: Progressing Goal: Diagnostic test results will improve Outcome: Progressing Goal: Respiratory complications will improve Outcome: Progressing Goal: Cardiovascular complication will be avoided Outcome: Progressing   Problem: Activity: Goal: Risk for activity intolerance will decrease Outcome: Progressing   Problem: Nutrition: Goal: Adequate nutrition will be maintained Outcome: Progressing   Problem: Coping: Goal: Level of anxiety will decrease Outcome: Progressing   Problem: Elimination: Goal: Will not experience complications related to bowel motility Outcome: Progressing Goal: Will not experience complications related to urinary retention Outcome: Progressing   Problem: Pain Managment: Goal: General experience of comfort will improve Outcome: Progressing   Problem: Safety: Goal: Ability to remain free from injury will improve Outcome: Progressing   Problem: Skin Integrity: Goal: Risk for impaired skin integrity will decrease Outcome:  Progressing   Problem: Education: Goal: Knowledge of condition will improve Outcome: Progressing Goal: Individualized Educational Video(s) Outcome: Progressing Goal: Individualized Newborn Educational Video(s) Outcome: Progressing   Problem: Activity: Goal: Will verbalize the importance of balancing activity with adequate rest periods Outcome: Progressing Goal: Ability to tolerate increased activity will improve Outcome: Progressing   Problem: Coping: Goal: Ability to identify and utilize available resources and services will improve Outcome: Progressing   Problem: Life Cycle: Goal: Chance of risk for complications during the postpartum period will decrease Outcome: Progressing   Problem: Role Relationship: Goal: Ability to demonstrate positive interaction with newborn will improve Outcome: Progressing   Problem: Skin Integrity: Goal: Demonstration of wound healing without infection will improve Outcome: Progressing

## 2020-12-26 NOTE — Lactation Note (Signed)
This note was copied from a baby's chart. Lactation Consultation Note  Patient Name: Megan Mclaughlin Date: 12/26/2020 Reason for consult: Follow-up assessment;Mother's request Age:33 hours  I followed up for scheduled appointment to assist with latch. Baby was sleepy and not cueing to latch. We hand expressed and rubbed some colostrum on to baby's lips. Baby was experiencing bradycardias today. We discontinued the attempt.   Maternal Data Does the patient have breastfeeding experience prior to this delivery?: No  Feeding Mother's Current Feeding Choice: Breast Milk and Donor Milk  LATCH Score Latch: Too sleepy or reluctant, no latch achieved, no sucking elicited.  Audible Swallowing: None  Type of Nipple: Everted at rest and after stimulation  Comfort (Breast/Nipple): Soft / non-tender  Hold (Positioning): Assistance needed to correctly position infant at breast and maintain latch.  LATCH Score: 5   Lactation Tools Discussed/Used Tools: Pump;Other (comment) (storage bottles; cleaning supplies) Breast pump type: Double-Electric Breast Pump Pump Education: Setup, frequency, and cleaning Reason for Pumping: NICU Pumping frequency: q 3 hours Pumped volume: 3 mL (mls)  Interventions Interventions: Breast feeding basics reviewed;Skin to skin;Hand express;Education  Discharge    Consult Status Consult Status: Follow-up Date: 12/27/20 Follow-up type: In-patient    Lenore Manner 12/26/2020, 12:19 PM

## 2020-12-27 ENCOUNTER — Encounter (HOSPITAL_COMMUNITY): Payer: Self-pay | Admitting: Obstetrics and Gynecology

## 2020-12-27 LAB — SURGICAL PATHOLOGY

## 2020-12-27 MED ORDER — IBUPROFEN 600 MG PO TABS: 600.0000 mg | ORAL_TABLET | Freq: Four times a day (QID) | ORAL | 0 refills | Status: AC

## 2020-12-27 MED ORDER — ACETAMINOPHEN 500 MG PO TABS: 1000.0000 mg | ORAL_TABLET | Freq: Four times a day (QID) | ORAL | 0 refills | Status: AC

## 2020-12-27 MED ORDER — OXYCODONE HCL 5 MG PO TABS: 5.0000 mg | ORAL_TABLET | ORAL | 0 refills | Status: AC | PRN

## 2020-12-27 NOTE — Discharge Instructions (Signed)

## 2020-12-27 NOTE — Discharge Summary (Signed)
OB Discharge Summary  Patient Name: Megan Mclaughlin DOB: October 06, 1985 MRN: [id number]  Date of admission: 12/24/2020 Delivering provider: Tiana Loft E   Admitting diagnosis: Preterm premature rupture of membranes (PPROM) with unknown onset of labor [O42.919] History of preterm premature rupture of membranes (PPROM) [Z87.59] Intrauterine pregnancy: [redacted]w[redacted]d     Secondary diagnosis: Patient Active Problem List   Diagnosis Date Noted  . Preterm premature rupture of membranes (PPROM) with unknown onset of labor 12/24/2020  . Status post primary low transverse cesarean section NRFHTs 12/24/2020  . Postpartum care following cesarean delivery 5/24 12/24/2020    Date of discharge: 12/27/2020   Discharge diagnosis: Principal Problem:   Postpartum care following cesarean delivery 5/24 Active Problems:   Preterm premature rupture of membranes (PPROM) with unknown onset of labor   Status post primary low transverse cesarean section NRFHTs                                                         Post partum procedures:None  Augmentation: N/A Pain control: Spinal  Laceration:None  Episiotomy:None  Complications: None  Hospital course:  Onset of Labor With Unplanned C/S   35 y.o. yo G1P0101 at [redacted]w[redacted]d was admitted in Latent Labor on 12/24/2020. Patient had a labor course significant for PPROM. The patient went for cesarean section due to Non-Reassuring FHR. Delivery details as follows: Membrane Rupture Time/Date: 4:45 AM ,12/24/2020   Delivery Method:C-Section, Low Transverse  Details of operation can be found in separate operative note. Patient had an uncomplicated postpartum course.  She is ambulating,tolerating a regular diet, passing flatus, and urinating well.  Patient is discharged home in stable condition 12/27/20.  Newborn Data: Birth date:12/24/2020  Birth time:5:52 PM  Gender:Female  Living status:Living  Apgars:8 ,9  Weight:2460 g   Physical exam  Vitals:   12/27/20 0433 12/27/20  0809 12/27/20 0811 12/27/20 1158  BP: 111/72 114/75 114/75 119/76  Pulse: 76 84 84 (!) 101  Resp: 18   18  Temp: 98.2 F (36.8 C) 98.7 F (37.1 C)  98.4 F (36.9 C)  TempSrc: Oral Oral  Oral  SpO2: 99% 97%  95%  Weight:      Height:       General: alert, cooperative and no distress Lochia: appropriate Uterine Fundus: firm Incision: Healing well with no significant drainage, Dressing is clean, dry, and intact DVT Evaluation: No evidence of DVT seen on physical exam. No significant calf/ankle edema. Labs: Lab Results  Component Value Date   WBC 17.8 (H) 12/25/2020   HGB 11.1 (L) 12/25/2020   HCT 34.7 (L) 12/25/2020   MCV 83.2 12/25/2020   PLT 233 12/25/2020   CMP Latest Ref Rng & Units 09/09/2020  Glucose 70 - 99 mg/dL 119(H)  BUN 6 - 20 mg/dL 5(L)  Creatinine 0.44 - 1.00 mg/dL 0.54  Sodium 135 - 145 mmol/L 134(L)  Potassium 3.5 - 5.1 mmol/L 4.1  Chloride 98 - 111 mmol/L 104  CO2 22 - 32 mmol/L 20(L)  Calcium 8.9 - 10.3 mg/dL 9.8  Total Protein 6.5 - 8.1 g/dL 6.7  Total Bilirubin 0.3 - 1.2 mg/dL 0.4  Alkaline Phos 38 - 126 U/L 54  AST 15 - 41 U/L 22  ALT 0 - 44 U/L 28   Edinburgh Postnatal Depression Scale Screening Tool 12/27/2020 12/25/2020  I have  been able to laugh and see the funny side of things. 2 (No Data)  I have looked forward with enjoyment to things. 2 -  I have blamed myself unnecessarily when things went wrong. 1 -  I have been anxious or worried for no good reason. 2 -  I have felt scared or panicky for no good reason. 1 -  Things have been getting on top of me. 1 -  I have been so unhappy that I have had difficulty sleeping. 0 -  I have felt sad or miserable. 0 -  I have been so unhappy that I have been crying. 0 -  The thought of harming myself has occurred to me. 0 -  Edinburgh Postnatal Depression Scale Total 9 -    Vaccines: TDaP UTD         COVID-19   UTD  Discharge instructions:  per After Visit Summary  After Visit Meds:  Allergies as  of 12/27/2020   No Known Allergies     Medication List    STOP taking these medications   oxyCODONE-acetaminophen 5-325 MG tablet Commonly known as: PERCOCET/ROXICET     TAKE these medications   acetaminophen 500 MG tablet Commonly known as: TYLENOL Take 2 tablets (1,000 mg total) by mouth every 6 (six) hours.   cholecalciferol 25 MCG (1000 UNIT) tablet Commonly known as: VITAMIN D3 Take 1,000 Units by mouth daily.   ibuprofen 600 MG tablet Commonly known as: ADVIL Take 1 tablet (600 mg total) by mouth every 6 (six) hours.   multivitamin-prenatal 27-0.8 MG Tabs tablet Take by mouth daily at 12 noon.   oxyCODONE 5 MG immediate release tablet Commonly known as: Oxy IR/ROXICODONE Take 1-2 tablets (5-10 mg total) by mouth every 4 (four) hours as needed for moderate pain.      Diet: routine diet  Activity: Advance as tolerated. Pelvic rest for 6 weeks.   Newborn Data: Live born female  Birth Weight: 5 lb 6.8 oz (2460 g) APGAR: 8, 9  Newborn Delivery   Birth date/time: 12/24/2020 17:52:00 Delivery type: C-Section, Low Transverse Trial of labor: No C-section categorization: Primary     Named [place] Baby Feeding: Bottle and NG tube in NICU Disposition:NICU  Delivery Report:  Review the Delivery Report for details.    Follow up:  Follow-up Information    Azucena Fallen, MD. Schedule an appointment as soon as possible for a visit in 6 week(s).   Specialty: Obstetrics and Gynecology Contact information: [place] [address] [phone]              Zettie Pho, MSN 12/27/2020, 1:43 PM

## 2020-12-27 NOTE — Lactation Note (Signed)
This note was copied from a baby's chart. Lactation Consultation Note LC to mother's room for f/u visit. She is pumping frequently with increasing amounts. Will plan f/u in NICU to assist with bf'ing prn. Patient Name: Megan Mclaughlin SHNGI'T Date: 12/27/2020 Reason for consult: NICU baby;Follow-up assessment;Other (Comment) (maternal d/c) Age:35 years  Maternal Data  +breast changes  No s/s engorgement today  Feeding Mother's Current Feeding Choice: Breast Milk and Donor Milk   Interventions Interventions: Education;Breast feeding basics reviewed;Expressed milk;DEBP  Discharge Discharge Education: Engorgement and breast care  Consult Status Consult Status: Follow-up Follow-up type: In-patient   [name], MA IBCLC 12/27/2020, 11:31 AM

## 2020-12-27 NOTE — Plan of Care (Signed)
Patient to be discharged home with printed instructions. [name], RN  

## 2020-12-28 ENCOUNTER — Ambulatory Visit: Payer: Self-pay

## 2020-12-28 NOTE — Lactation Note (Signed)
This note was copied from a baby's chart. Lactation Consultation Note  Patient Name: Megan Mclaughlin Date: 12/28/2020 Reason for consult: Follow-up assessment;Primapara;1st time breastfeeding;NICU baby;Infant < 6lbs;Late-preterm 34-36.6wks Age:33 days   LC in to assist/assess with positioning and latching to the breast.   Baby awakened at feeding time and started cueing and becoming fussy.  Mom last pumped 2 hrs prior.  Breasts are filling, not engorged.  Right breast has some hard areas on outer aspect of breast.  Baby positioned STS on Mom's chest, and then started crawling down to find breast.    Positioned baby in cross cradle, guiding Mom to support her breast and baby's head from ear to ear.  Baby sucking on and off for 5 mins.  Changed position to football on right breast.  Initiated a 20 mm nipple shield, showing parents how to invert first while stretching over nipple.  Baby settled into a better suck pattern and fed another 8 minutes on and off, 2 swallows identified.  Warm packs and massage over right outer breast prior to pumping.   Mom to pump on maintenance setting for 15-30 mins every 2-3 hrs while awake.  Mom advised to awaken at least every 4 hrs at night.  F/U with lactation 5/30 at 12 noon.    LATCH Score Latch: Repeated attempts needed to sustain latch, nipple held in mouth throughout feeding, stimulation needed to elicit sucking reflex.  Audible Swallowing: A few with stimulation  Type of Nipple: Everted at rest and after stimulation  Comfort (Breast/Nipple): Filling, red/small blisters or bruises, mild/mod discomfort (right breast very full)  Hold (Positioning): Assistance needed to correctly position infant at breast and maintain latch.  LATCH Score: 6   Lactation Tools Discussed/Used Tools: Pump;Flanges;Nipple Shields Nipple shield size: 20 Flange Size: 24 Breast pump type: Double-Electric Breast Pump Pumping frequency: Q 3 hrs Pumped  volume: 50 mL  Interventions Interventions: Assisted with latch;Skin to skin;Breast massage;Hand express;Breast compression;Adjust position;Support pillows;Position options;DEBP;Education   Consult Status Consult Status: Follow-up Date: 12/30/20 Follow-up type: [place] 12/28/2020, 12:57 PM

## 2020-12-30 ENCOUNTER — Ambulatory Visit: Payer: Self-pay

## 2020-12-30 NOTE — Lactation Note (Signed)
This note was copied from a baby's chart. Lactation Consultation Note  Patient Name: Megan Mclaughlin GYBWL'S Date: 12/30/2020 Reason for consult: Follow-up assessment;Primapara;1st time breastfeeding;NICU baby;Infant < 6lbs;Late-preterm 34-36.6wks;Engorgement;Other (Comment) (engorged area on the right breast - lateral aspect/ left - smaller area of upper lateral aspect  - LC to provide ice packs and instructed mom to ice for 15 -20 mins/ pump for 15 -20 mins. LC  reviewed engorgement prevention and tx.)  Small tiny white plug appeared on the front of the right nipple in the last day ( per mom )  Age:33 days  LC recommended since her milk is in when her breast are full to pump both breast for the 15 -20 mins and not wait until then are firm . Think Prevention of engorgement to protect the milk supply and prevent issues.   Maternal Data Has patient been taught Hand Expression?: Yes  Feeding Mother's Current Feeding Choice: Breast Milk Nipple Type: Nfant Extra Slow Flow (gold)  LATCH Score Latch: Repeated attempts needed to sustain latch, nipple held in mouth throughout feeding, stimulation needed to elicit sucking reflex.  Audible Swallowing: A few with stimulation  Type of Nipple: Everted at rest and after stimulation  Comfort (Breast/Nipple): Filling, red/small blisters or bruises, mild/mod discomfort  Hold (Positioning): Assistance needed to correctly position infant at breast and maintain latch.  LATCH Score: 6   Lactation Tools Discussed/Used Tools: Pump Flange Size: 24 Breast pump type: Double-Electric Breast Pump  Interventions Interventions: Breast feeding basics reviewed;Assisted with latch;Skin to skin;Breast massage;Reverse pressure;Adjust position;Support pillows;Position options;DEBP;Education  Discharge Discharge Education: Engorgement and breast care Pump: DEBP;Personal  Consult Status Consult Status: Follow-up Date: 12/31/20 Follow-up type:  In-patient    [place] 12/30/2020, 12:39 PM

## 2020-12-31 ENCOUNTER — Ambulatory Visit: Payer: Self-pay

## 2020-12-31 NOTE — Lactation Note (Signed)
This note was copied from a baby's chart. Lactation Consultation Note LC to room to check status of mom's engorgement. Improved, per her account. Baby was sleeping at breast. We reviewed normalcy. Will plan return visit to assist prn.   Patient Name: Megan Mclaughlin HUDJS'H Date: 12/31/2020 Reason for consult: NICU baby;Follow-up assessment Age:35 years   Feeding Mother's Current Feeding Choice: Breast Milk  LATCH Score Latch: Repeated attempts needed to sustain latch, nipple held in mouth throughout feeding, stimulation needed to elicit sucking reflex.  Audible Swallowing: None  Type of Nipple: Everted at rest and after stimulation  Comfort (Breast/Nipple): Filling, red/small blisters or bruises, mild/mod discomfort  Hold (Positioning): Assistance needed to correctly position infant at breast and maintain latch.  LATCH Score: 5   Interventions Interventions: Education  Consult Status Consult Status: Follow-up Follow-up type: In-patient   [name], MA IBCLC 12/31/2020, 12:41 PM

## 2021-01-02 ENCOUNTER — Ambulatory Visit: Payer: Self-pay

## 2021-01-02 NOTE — Lactation Note (Signed)
This note was copied from a baby's chart. Lactation Consultation Note  Patient Name: Girl Timisha Mondry LFYBO'F Date: 01/02/2021 Reason for consult: NICU baby;Late-preterm 34-36.6wks Age:33 days  Visited with mom for 6 pm feeding per her request, but baby was still sleeping and very drowsy when parents tried to wake her up. Mom requested another feeding assist for tomorrow at noon. LC put the appt down in the NICU appt book.  Mom voiced that previous [place] told her we didn't have a NS # 24 size available, so she's using a # 20. Explained to mom about supply shortages and she said she's going to the store to get her own NS # 24 to try it tomorrow with baby.  Feeding plan:  1. Encouraged mom to continue pumping every 2-3 hours, at least 8 pumping sessions/24 hours 2. Mom will call for feeding assist when needed and will try NS # 20-24  Parents reported all questions and concerns were answered, they're both aware of [facility] services and will call PRN.   Maternal Data Has patient been taught Hand Expression?: Yes  Feeding Mother's Current Feeding Choice: Breast Milk  LATCH Score Latch: Repeated attempts needed to sustain latch, nipple held in mouth throughout feeding, stimulation needed to elicit sucking reflex.  Audible Swallowing: None  Type of Nipple: Everted at rest and after stimulation  Comfort (Breast/Nipple): Soft / non-tender  Hold (Positioning): Assistance needed to correctly position infant at breast and maintain latch.  LATCH Score: 6   Lactation Tools Discussed/Used Tools: Nipple Shields Nipple shield size: 20 Breast pump type: Double-Electric Breast Pump Reason for Pumping: NICU Pumping frequency: Q 3 hours  Interventions Interventions: Breast feeding basics reviewed;Assisted with latch;Skin to skin;Breast massage;Hand express;Position options;Support pillows;Adjust position  Discharge Pump: DEBP  Consult Status Consult Status: Follow-up Date:  01/02/21 Follow-up type: In-patient    Keino Placencia Francene Boyers 01/02/2021, 6:33 PM

## 2021-01-02 NOTE — Lactation Note (Signed)
This note was copied from a baby's chart. Lactation Consultation Note  Patient Name: Megan Mclaughlin [id number] Date: 01/02/2021 Reason for consult: NICU baby;Late-preterm 34-36.6wks Age:33 days   LC unwrapped infant for STS.  Mom attempted to latch on the left breast and hand expressing but infant could not sustain latch.  LC used a teacup hold to try to help infant grasp the breast but after a couple of sucks, infant slipped from the breast.  LC had infant suck her gloved finger.  Infant has very tight jaws and does not open wide.  LC was able to flange lips around finger but infant prefers to keep them pursed and tucked.   Size 20 NS used.  Mom was shown how to properly apply shield.   Infant attempted to latch in a football hold but would only clamp down onto shield.   Delta worked with infant to suck gloved finger again, this time transferring him to the breast once a good sucking pattern was achieving on the finger.    Infant did latch and take a few sucks but then would cease sucking and clamp down again with tucked upper and lower lips.  LC tried to assist with widening infant's gape by gently tugging at the chin.  Mom changed to reposition infant in cross cradle which helped some and infant took more sucks; no swallows noted but the latch was deeper and NS was not visible.    LC praised mom and dad for efforts and encouraged them to continue to place infant STS.  Good positioning and latching tips reviewed with family.  Mom is pumping every 3 hours.    Another feed will be assessed at 6pm tonight.   Maternal Data Has patient been taught Hand Expression?: Yes  Feeding Mother's Current Feeding Choice: Breast Milk  LATCH Score Latch: Repeated attempts needed to sustain latch, nipple held in mouth throughout feeding, stimulation needed to elicit sucking reflex.  Audible Swallowing: None  Type of Nipple: Everted at rest and after stimulation  Comfort (Breast/Nipple): Soft /  non-tender  Hold (Positioning): Assistance needed to correctly position infant at breast and maintain latch.  LATCH Score: 6   Lactation Tools Discussed/Used Tools: Nipple Shields Nipple shield size: 20 Breast pump type: Double-Electric Breast Pump Reason for Pumping: NICU Pumping frequency: Q 3 hours  Interventions Interventions: Breast feeding basics reviewed;Assisted with latch;Skin to skin;Breast massage;Hand express;Position options;Support pillows;Adjust position  Discharge Pump: DEBP  Consult Status Consult Status: Follow-up Date: 01/02/21 Follow-up type: In-patient    Ferne Coe [facility] 01/02/2021, 5:05 PM

## 2021-01-03 ENCOUNTER — Ambulatory Visit: Payer: Self-pay

## 2021-01-03 NOTE — Lactation Note (Signed)
This note was copied from a baby's chart. Lactation Consultation Note  Patient Name: Girl Estell Puccini OMBTD'H Date: 01/03/2021 Reason for consult: Follow-up assessment;NICU baby;Infant < 6lbs;1st time breastfeeding;Primapara;Late-preterm 34-36.6wks Age:32 days  LC in to assist/assess with positioning and latch to the breast.  Mom has been consistently pumping and expressing 100 ml 8 times per 24 hrs.  Mom's left breast is fuller, areola less compressible and nipple larger.  Sized for a 24 mm nipple shield.  Mom's right breast is more compressible with smaller sized nipple.  20 mm nipple shield applied for a better fit.  Nipple pulled well into shield.   Baby showing great feeding cues.  Placed baby in football hold on Mom's right breast.  Mom needing guidance on supporting breast and baby's head.  Baby opened, latched, tugged on chin to open mouth a little wider and to flange lips.  Baby sat at breast for a few minutes while Mom talked soothingly to baby.  Baby started sucking with swallows identified.  Baby paced herself well.  Some stress cues noted, but baby didn't sustain these.  Education done.  Baby became more rhythmic and Mom taught to slightly compress breast.  Baby swallowed regularly, identifying pause at drop of jaw.   After 8 mins, baby came off, assisted Mom to sit baby up to burp and then she placed her STS on her chest.  Baby contented and asleep.  Noted Mom's pump parts needed washing.  Provided a second bin for washing and drying.  Reinforced importance of washing after pumping to allow parts to air dry before pumping again.   Baby getting full gavage feeding currently.  F/U 12/06/20 at 0900   LATCH Score Latch: Repeated attempts needed to sustain latch, nipple held in mouth throughout feeding, stimulation needed to elicit sucking reflex.  Audible Swallowing: A few with stimulation  Type of Nipple: Everted at rest and after stimulation  Comfort (Breast/Nipple): Soft /  non-tender  Hold (Positioning): Assistance needed to correctly position infant at breast and maintain latch.  LATCH Score: 7   Lactation Tools Discussed/Used Tools: Pump;Nipple [place] [employer];Flanges Nipple shield size: 20;24 Flange Size: 27 Breast pump type: Double-Electric Breast Pump Pump Education: Setup, frequency, and cleaning Pumping frequency: 8 times per 24 hrs Pumped volume: 100 mL  Interventions Interventions: Assisted with latch;Skin to skin;Breast massage;Hand express;Breast compression;Adjust position;Support pillows;Position options;DEBP;Education   Consult Status Consult Status: Follow-up Date: 01/05/21 Follow-up type: In-patient    Broadus John 01/03/2021, 12:38 PM

## 2021-01-05 ENCOUNTER — Ambulatory Visit: Payer: Self-pay

## 2021-01-05 NOTE — Lactation Note (Signed)
This note was copied from a baby's chart. Lactation Consultation Note  Patient Name: Megan Mclaughlin Date: 01/05/2021 Reason for consult: Follow-up assessment;Primapara;1st time breastfeeding;NICU baby;Late-preterm 34-36.6wks;Infant < 6lbs Age:33 days   LC in to assist/assess with positioning and latching.  Baby was latched onto breast with a 20 mm nipple shield.  Baby noted to be in football hold on right breast.  Baby latched onto base of nipple.  FOB said baby had fed for 8 mins, swallowing heard.  After burping, baby started cueing again.    Assisted in cross cradle hold, Mom supporting her breast in a U hold.  Assisted placing nipple shield on and baby latched easily.  Showed FOB how to gently tug on chin to flange lower lip and open jaw wider.  Baby relaxed with a deeper latch to areola, and became very consistent sucking and swallowing.  Baby fed another 7 mins before stopping.  When baby came off the breast, milk noted in baby's mouth and in shield.    IDF guidelines, baby to receive 1/3 of gavage feeding.  Mom desires exclusive breastfeeding.  She is present for most daytime feedings and wants to breastfeed every other feeding so not to tire baby.    Lactation F/U 6/6 at 1200    LATCH Score Latch: Grasps breast easily, tongue down, lips flanged, rhythmical sucking.  Audible Swallowing: Spontaneous and intermittent  Type of Nipple: Everted at rest and after stimulation  Comfort (Breast/Nipple): Soft / non-tender  Hold (Positioning): Assistance needed to correctly position infant at breast and maintain latch.  LATCH Score: 9   Lactation Tools Discussed/Used Tools: Pump;Flanges;Nipple Shields Nipple shield size: 24 Flange Size: 27 Breast pump type: Double-Electric Breast Pump Pumping frequency: Q 3hrs Pumped volume: 100 mL  Interventions Interventions: Skin to skin;Assisted with latch;Breast feeding basics reviewed;Breast massage;Hand express;Pre-pump if  needed;Breast compression;Adjust position;Support pillows;Position options;Expressed milk (nipplie shield  Simultaneous filing. User may not have seen previous data.)  Discharge    Consult Status Consult Status: Follow-up Date: 01/06/21 Follow-up type: In-patient    Broadus John 01/05/2021, 4:15 PM

## 2021-01-05 NOTE — Lactation Note (Signed)
This note was copied from a baby's chart. Lactation Consultation Note  Patient Name: Megan Mclaughlin CYELY'H Date: 01/05/2021   Age:33 days   LC in room to assist with breastfeeding.  Mother not present.  RN will call [name] when she arrives prn.   Broadus John 01/05/2021, 9:27 AM

## 2021-01-06 ENCOUNTER — Ambulatory Visit: Payer: Self-pay

## 2021-01-06 NOTE — Lactation Note (Signed)
This note was copied from a baby's chart. Lactation Consultation Note Observed mostly non-nutritive feeding with intermittent short suckling bursts and about 3-4 minutes of rhythmic suckling/swallowing. Will return for next feeding to provide additional support. Emerging readiness for 72-hour bf'ing trial.   Patient Name: Megan Mclaughlin [id number] Date: 01/06/2021 Reason for consult: Other (Comment) (observed breastfeeding) Age:33 days  Maternal Data  normal milk supply Pumping 138mL 8xday  Feeding Mother's Current Feeding Choice: Breast Milk  LATCH Score Latch: Repeated attempts needed to sustain latch, nipple held in mouth throughout feeding, stimulation needed to elicit sucking reflex.  Audible Swallowing: A few with stimulation  Type of Nipple: Everted at rest and after stimulation  Comfort (Breast/Nipple): Soft / non-tender  Hold (Positioning): No assistance needed to correctly position infant at breast.  LATCH Score: 8   Consult Status Consult Status: Follow-up Date: 01/06/21 (3pm feeding) Follow-up type: In-patient   [name], MA IBCLC 01/06/2021, 12:41 PM

## 2021-01-06 NOTE — Lactation Note (Signed)
This note was copied from a baby's chart. Lactation Consultation Note Observed 13 minutes of rhythmic bf'ing with audible swallowing. Some pestering required.   Patient Name: Megan Mclaughlin [id number] Date: 01/06/2021 Reason for consult: Other (Comment) (Observed bf'ing) Age:33 days  Maternal Data  milk supply wnl  Feeding Mother's Current Feeding Choice: Breast Milk   Lactation Tools Discussed/Used  nipple shield  Interventions Interventions: Breast feeding basics reviewed;Breast compression;Education Parental education on nutritive vs non-nutritive suckling provided.   Consult Status Consult Status: Follow-up Date: 01/07/21 (6pm observed feeding) Follow-up type: [place], [place] IBCLC 01/06/2021, 3:33 PM

## 2021-01-07 ENCOUNTER — Ambulatory Visit: Payer: Self-pay

## 2021-01-07 NOTE — Lactation Note (Signed)
This note was copied from a baby's chart. Lactation Consultation Note  Patient Name: Megan Mclaughlin Date: 01/07/2021 Reason for consult: Follow-up assessment;Mother's request;Primapara;1st time breastfeeding;NICU baby;Late-preterm 34-36.6wks Age:33 wk.o.  I followed up with Megan Mclaughlin, her support person, and daughter, Megan Mclaughlin. Baby was awake and cueing, and calm. I assisted with undressing baby and placing her in cross cradle hold on the left breast. Megan Mclaughlin states that she produces more milk on this side (75 mls). She is using a My Breast Friend pillow.  I observed parents latch baby to the breast. We initially had to replace the nipple shield after baby bumped it off. I observed their patience as they allowed baby to relax at the breast and find rhythmic suckling sequences. Megan Mclaughlin support person (spouse) was very involved in helping with positioning baby and gently "pestering" baby to stay active. He also served as a Multimedia programmer.  When baby latched, I noted 1:1 suck:swallow ratio. Baby did a good job of self-pacing, allowing for momentary pauses to rest and breath. Baby's rate of suckling slowed as feeding progressed, and I educated that this is normal. Following the feeding, Megan Mclaughlin noted that baby softened her breast tissue.  Parents plan to stay today to breast feed at the appointed times. I praised them for their excellent teamwork and for baby's good feeding. Megan Mclaughlin states that she will pump after this feeding.  I made a follow up lactation appointment for 6/8 at 12 pm.  Maternal Data Does the patient have breastfeeding experience prior to this delivery?: No  Feeding Mother's Current Feeding Choice: Breast Milk  LATCH Score Latch: Grasps breast easily, tongue down, lips flanged, rhythmical sucking.  Audible Swallowing: Spontaneous and intermittent  Type of Nipple: Everted at rest and after stimulation  Comfort (Breast/Nipple): Soft /  non-tender  Hold (Positioning): No assistance needed to correctly position infant at breast.  LATCH Score: 10   Lactation Tools Discussed/Used Tools: Nipple Shields Breast pump type: Double-Electric Breast Pump Reason for Pumping: NICU Pumped volume: 110 mL (74 mls from left breast; less on the right)  Interventions Interventions: Breast feeding basics reviewed;Skin to skin;Breast compression;Support pillows;Education  Discharge Pump: DEBP  Consult Status Consult Status: Follow-up Date: 01/08/21 Follow-up type: In-patient    Lenore Manner 01/07/2021, 9:36 AM

## 2021-01-08 ENCOUNTER — Ambulatory Visit: Payer: Self-pay

## 2021-01-08 NOTE — Lactation Note (Signed)
This note was copied from a baby's chart. Lactation Consultation Note Observed 32 minute breastfeeding with audible swallows. Trial ad lib to begin.   Patient Name: Girl Zehava Turski [id number] Date: 01/08/2021 Reason for consult: Follow-up assessment;Other (Comment) (Breastfeeding assessment) Age:33 wk.o.  Feeding Mother's Current Feeding Choice: Breast Milk  LATCH Score Latch: Grasps breast easily, tongue down, lips flanged, rhythmical sucking.  Audible Swallowing: Spontaneous and intermittent  Type of Nipple: Everted at rest and after stimulation  Comfort (Breast/Nipple): Soft / non-tender  Hold (Positioning): No assistance needed to correctly position infant at breast.  LATCH Score: 10   Lactation Tools Discussed/Used  nipple shield  Interventions Interventions:  (IDF)  Consult Status Consult Status: Follow-up Follow-up type: In-patient   [name], MA IBCLC 01/08/2021, 12:47 PM

## 2021-01-09 ENCOUNTER — Ambulatory Visit: Payer: Self-pay

## 2021-01-09 NOTE — Lactation Note (Signed)
This note was copied from a baby's chart. Lactation Consultation Note  Patient Name: Megan Mclaughlin [id number] Date: 01/09/2021 Reason for consult: Primapara;1st time breastfeeding;Early term 37-38.6wks;NICU baby Age:33 wk.o.  LC in to assist baby at the breast during breastfeeding.  Mom expresses less milk from right breast.  Due to concern about baby coughing with an active letdown from left breast and having bradycardic episodes, Mom focusing on latching baby on right breast at present.  Mom continues to express and support her milk supply on left breast.  Mom using a 20 mm nipple shield and baby latched and fed well for 15 mins.  Baby contented after feeding.  Mom desires OP lactation f/u.  Baby may be discharged tomorrow.  Mom became emotional and started crying hearing this.   LC will send an OP message to clinic and ask NNP for referral.   LATCH Score Latch: Grasps breast easily, tongue down, lips flanged, rhythmical sucking.  Audible Swallowing: Spontaneous and intermittent  Type of Nipple: Everted at rest and after stimulation  Comfort (Breast/Nipple): Soft / non-tender  Hold (Positioning): No assistance needed to correctly position infant at breast.  LATCH Score: 10   Lactation Tools Discussed/Used Tools: Pump;Flanges;Nipple Shields Nipple shield size: 20 Breast pump type: Double-Electric Breast Pump Pumping frequency: Q 3hrs Pumped volume: 110 mL  Interventions Interventions: Breast feeding basics reviewed;Education;DEBP;Breast compression;Pre-pump if needed  Discharge Discharge Education: Outpatient recommendation  Consult Status Consult Status: Follow-up Date: 01/10/21 Follow-up type: In-patient    Broadus John 01/09/2021, 12:33 PM

## 2021-01-10 ENCOUNTER — Ambulatory Visit: Payer: Self-pay

## 2021-01-10 NOTE — Lactation Note (Signed)
This note was copied from a baby's chart. Lactation Consultation Note  Patient Name: [name] Date: 01/10/2021 Reason for consult: Follow-up assessment;NICU baby Age:33 wk.o.  Mom breastfeeding when [facility] entered room.  Mom states infant has been feeding for 17 minutes.  Mom had infant latched in cross cradle on the left side.  LC heard a few swallows when she entered the room at the end of the feeding.  Infant was content and relaxed. Mom used NS 20 during feeding.  Mom feels infant is feeding very well.  She partially pumped the left side prior to feeding infant for this breastfeed.  LC discussed support groups and out patient appointment after DC.  Possible DC today.  Mom will call out for [facility] if she has more questions or concerns prior to DC.      Maternal Data    Feeding Mother's Current Feeding Choice: Breast Milk  LATCH Score Latch: Grasps breast easily, tongue down, lips flanged, rhythmical sucking.  Audible Swallowing: Spontaneous and intermittent  Type of Nipple: Everted at rest and after stimulation  Comfort (Breast/Nipple): Soft / non-tender  Hold (Positioning): No assistance needed to correctly position infant at breast.  LATCH Score: 10   Lactation Tools Discussed/Used Tools: Nipple Shields Nipple shield size: 20 Breast pump type: Double-Electric Breast Pump Reason for Pumping: NICU Pumping frequency: q 3 hours Pumped volume: 115 mL  Interventions Interventions: Breast feeding basics reviewed  Discharge Discharge Education: Warning signs for feeding baby;Outpatient recommendation  Consult Status Consult Status: Follow-up Date: 01/11/21 Follow-up type: In-patient    Ferne Coe [facility] 01/10/2021, 9:27 AM

## 2021-01-11 ENCOUNTER — Ambulatory Visit: Payer: Self-pay

## 2021-01-11 NOTE — Lactation Note (Signed)
This note was copied from a baby's chart. Lactation Consultation Note Mother and baby bf'ing independently. Reviewed feeding norms at 37 weeks. Will plan return visit and demonstrate positions to decrease rate of milk flow.   Patient Name: Megan Mclaughlin [id number] Date: 01/11/2021   Age:33 wk.o.  Maternal Data  Milk supply is wnl   Feeding  Ad lib bf   [name], MA IBCLC 01/11/2021, 12:22 PM

## 2021-01-12 ENCOUNTER — Ambulatory Visit: Payer: Self-pay

## 2021-01-12 NOTE — Lactation Note (Signed)
This note was copied from a baby's chart. Lactation Consultation Note Paged to assist. Baby woke briefly. Will return.   Patient Name: [name] [id number] Date: 01/12/2021   Age:33 wk.o.   [name], MA IBCLC 01/12/2021, 10:38 AM

## 2021-01-12 NOTE — Lactation Note (Signed)
This note was copied from a baby's chart. Lactation Consultation Note Infant transferred 18mL in 19 minutes and appeared satisfied. Encouraged frequent feedings (10-12 per day) and continuing to feed with cues.   Will return to demonstrate use of supplemental feeding system at breast for any fortified breast milk needs p d/c to support infant's +weight gain. Parents will f/u with community-based IBCLC for further bf'ing support prn.   Patient Name: Megan Mclaughlin [id number] Date: 01/12/2021 Reason for consult: Other (Comment) (AC/PC weighted feeding) Age:67 wk.o.  Maternal Data  Milk volume is wnl  Feeding Mother's Current Feeding Choice: Breast Milk  LATCH Score Latch: Grasps breast easily, tongue down, lips flanged, rhythmical sucking.  Audible Swallowing: Spontaneous and intermittent  Type of Nipple: Everted at rest and after stimulation  Comfort (Breast/Nipple): Soft / non-tender  Hold (Positioning): No assistance needed to correctly position infant at breast.  LATCH Score: 10   Lactation Tools Discussed/Used Nipple shield size: 20  Interventions Interventions: Education  Discharge Discharge Education: Outpatient recommendation  Consult Status Consult Status: Follow-up Follow-up type: In-patient   [name], MA IBCLC 01/12/2021, 12:38 PM

## 2022-10-02 IMAGING — CR DG CHEST 1V
1 series · 1 of 1 positions shown · non-contrast
Comparison: None.

CLINICAL DATA: Positive QuantiFERON-TB Gold test

Pregnant patient, trimester not indicated.
EXAM:
CHEST  1 VIEW

[w chest pa]
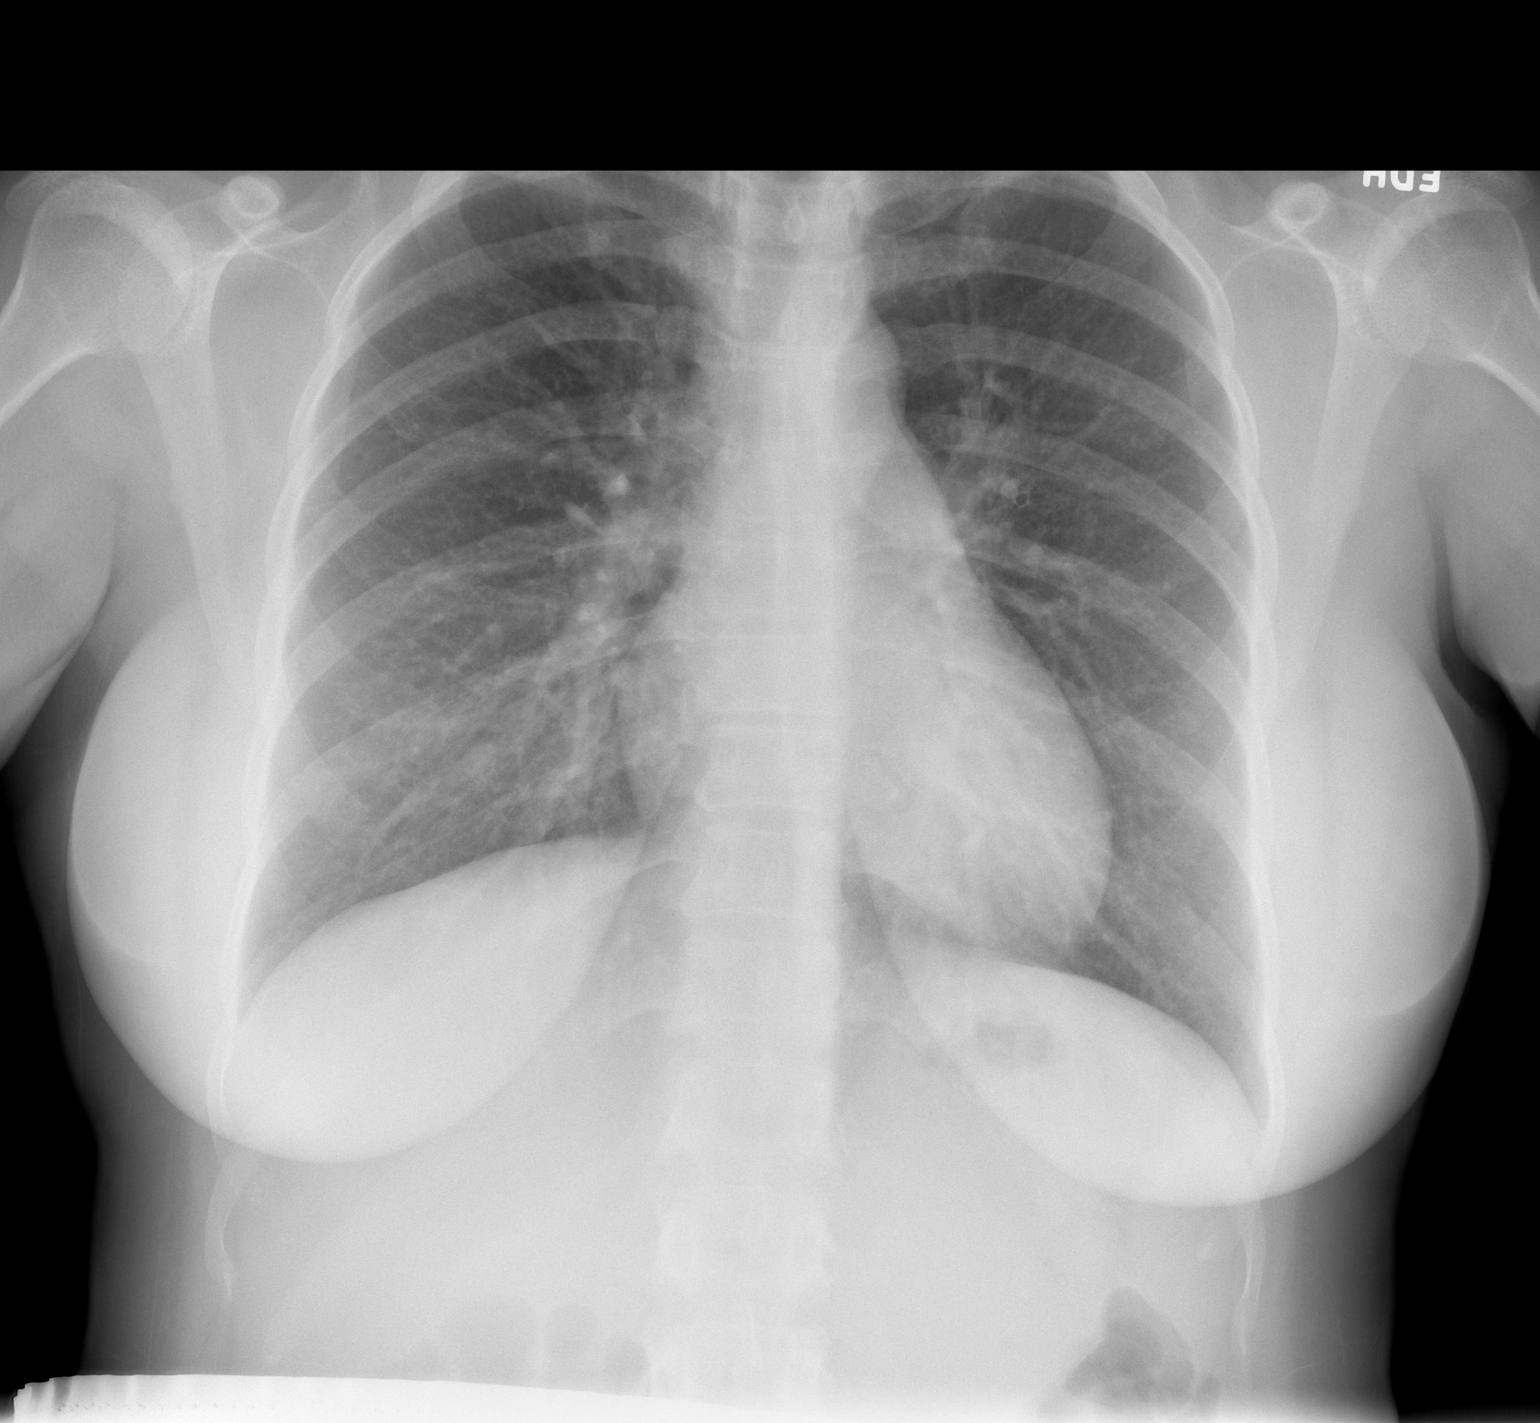

[1 of 1 positions shown; findings below may reference images not displayed]

FINDINGS: The cardiomediastinal contours are normal. No radiographic evidence
of adenopathy. Pulmonary vasculature is normal. No consolidation,
pleural effusion, or pneumothorax. No acute osseous abnormalities
are seen.
IMPRESSION: Negative AP view of the chest.
